# Patient Record
Sex: Female | Born: 1985 | Race: Black or African American | Hispanic: No | Marital: Married
Health system: Midwestern US, Community
[De-identification: ages and names within clinical notes are randomized; demographics above are authoritative.]

## PROBLEM LIST (undated history)

## (undated) DIAGNOSIS — J45909 Unspecified asthma, uncomplicated: Secondary | ICD-10-CM

## (undated) DIAGNOSIS — R7612 Nonspecific reaction to cell mediated immunity measurement of gamma interferon antigen response without active tuberculosis: Secondary | ICD-10-CM

## (undated) HISTORY — DX: Unspecified asthma, uncomplicated: J45.909

## (undated) HISTORY — DX: Nonspecific reaction to cell mediated immunity measurement of gamma interferon antigen response without active tuberculosis: R76.12

---

## 2007-10-01 ENCOUNTER — Inpatient Hospital Stay: Payer: Self-pay | Admitting: Obstetrics and Gynecology

## 2008-06-26 ENCOUNTER — Ambulatory Visit: Payer: Self-pay | Admitting: Internal Medicine

## 2008-09-23 ENCOUNTER — Ambulatory Visit: Payer: Self-pay | Admitting: Internal Medicine

## 2009-07-12 IMAGING — US ABDOMEN ULTRASOUND
1 series · 17 of 25 positions shown · non-contrast
Comparison: none

REASON FOR EXAM: Chest pain, RLQ and  LLQ pain
COMMENTS:

[Series 1: abdomen ultrasound · 17 of 55 slices shown]
[im 1/55]
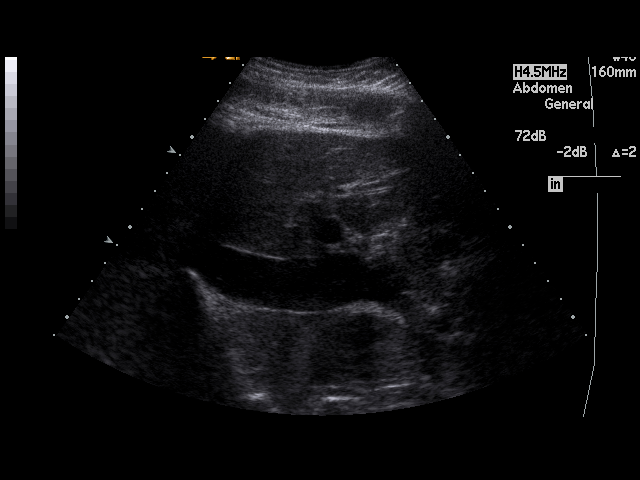
[im 5/55]
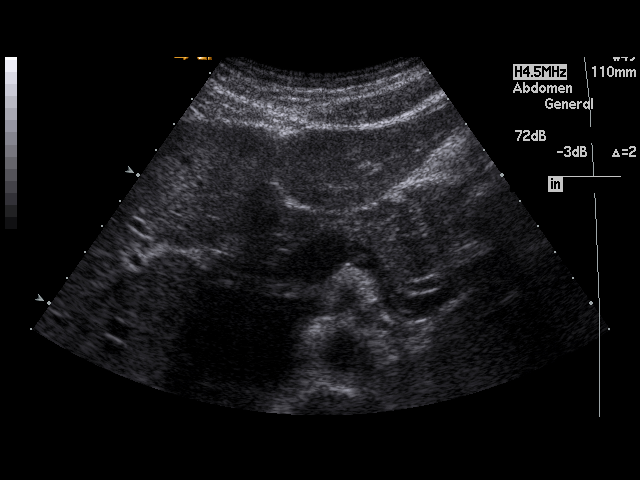
[im 7/55]
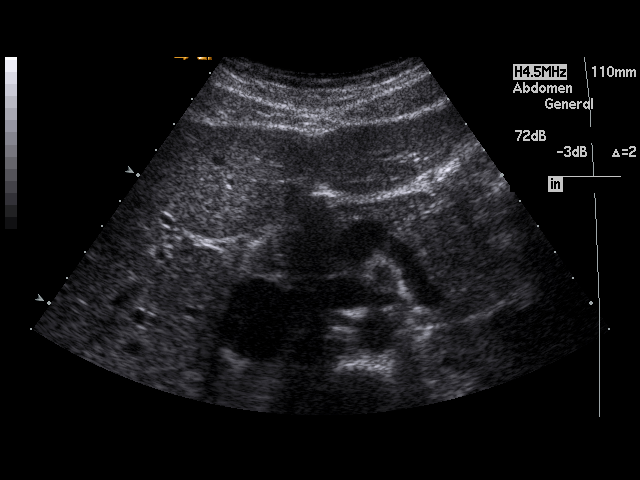
[im 12/55]
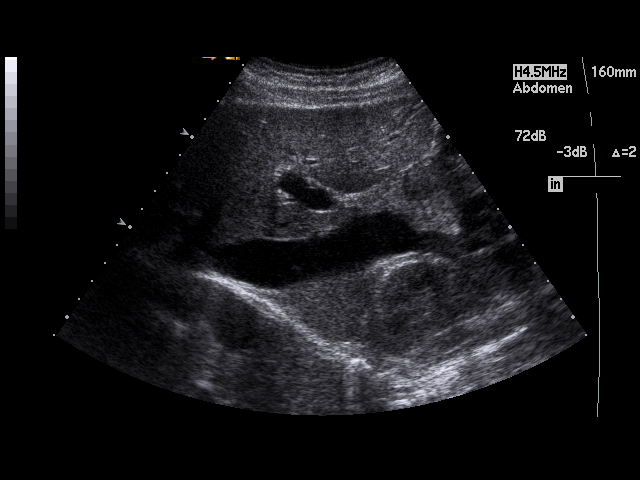
[im 14/55]
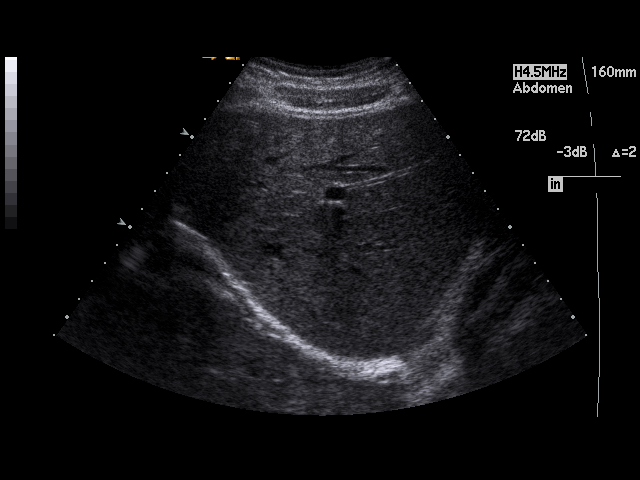
[im 19/55]
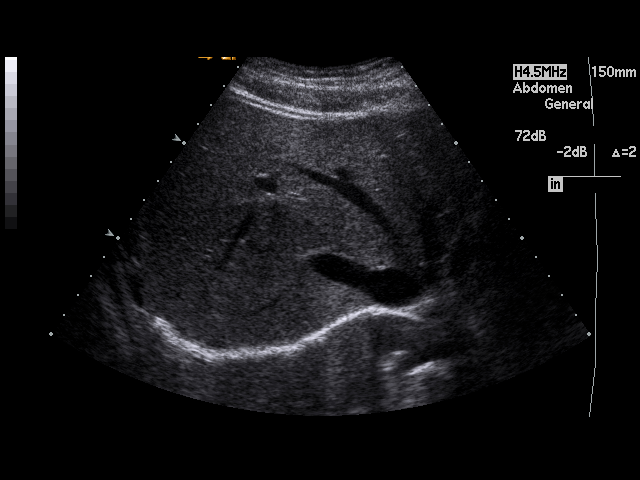
[im 21/55]
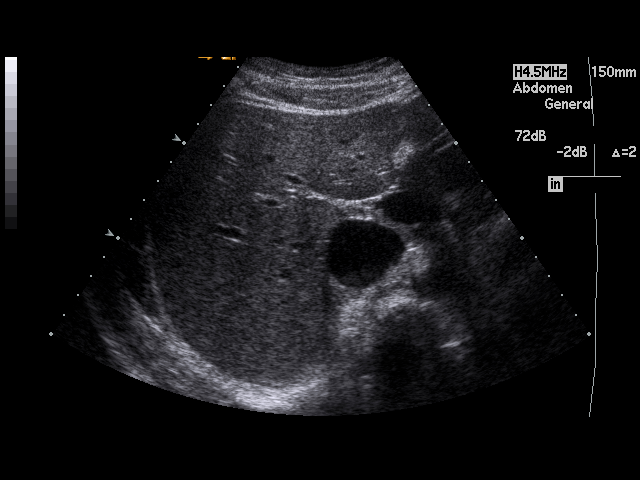
[im 25/55]
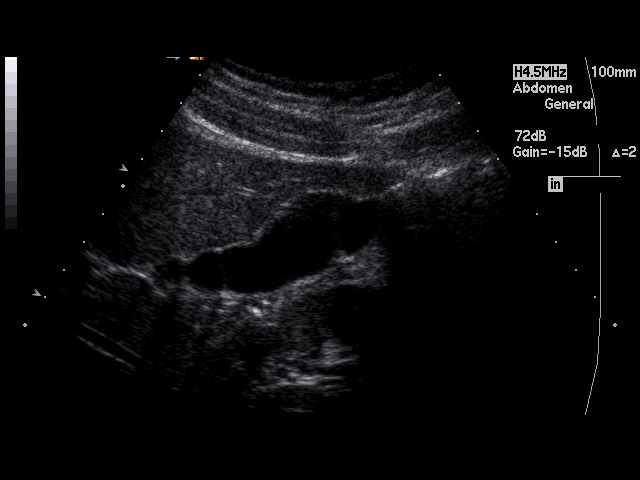
[im 28/55]
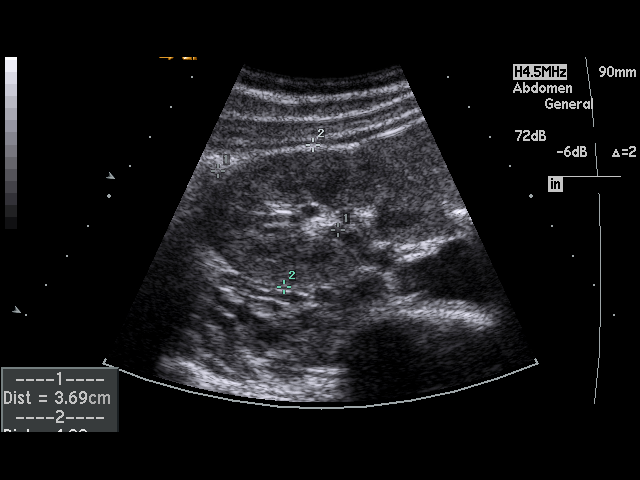
[im 30/55]
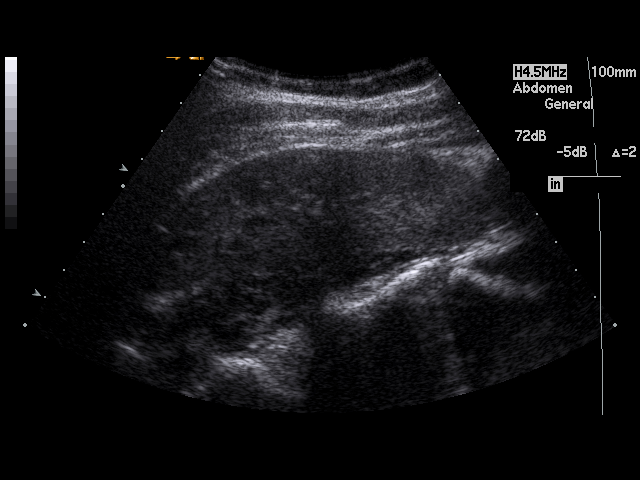
[im 34/55]
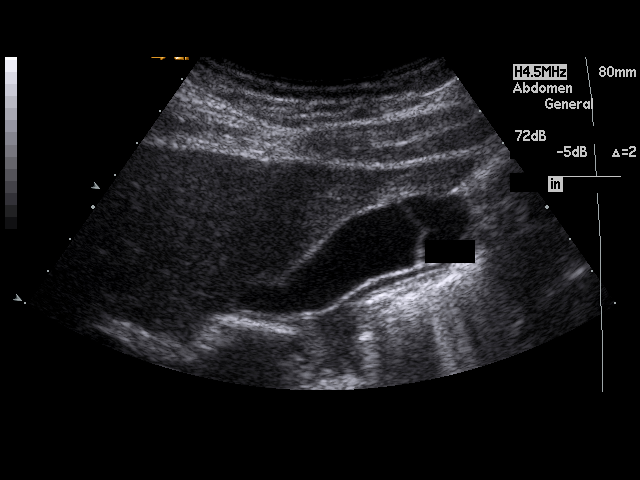
[im 37/55]
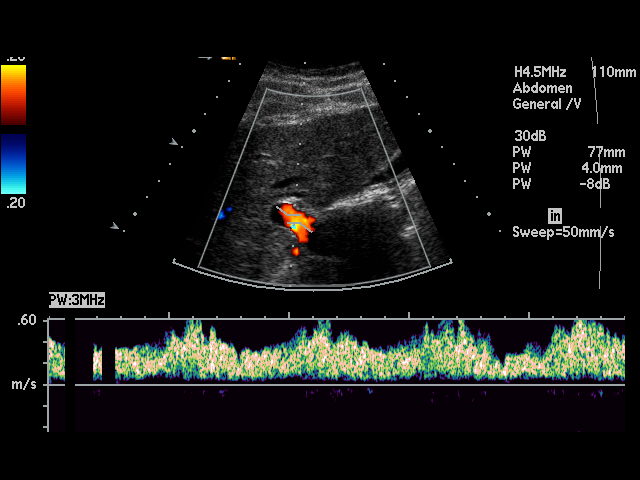
[im 41/55]
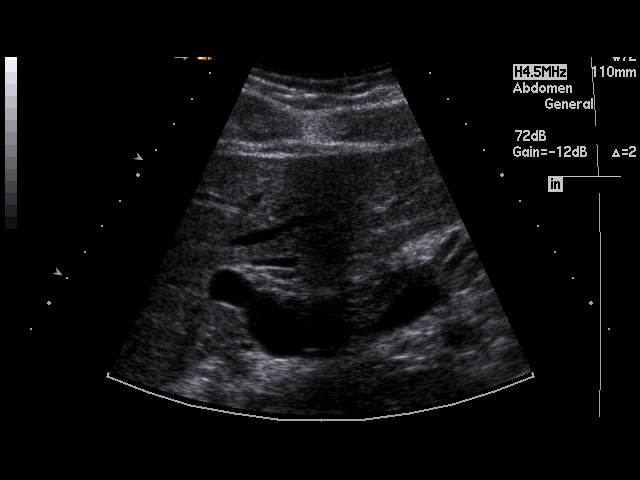
[im 43/55]
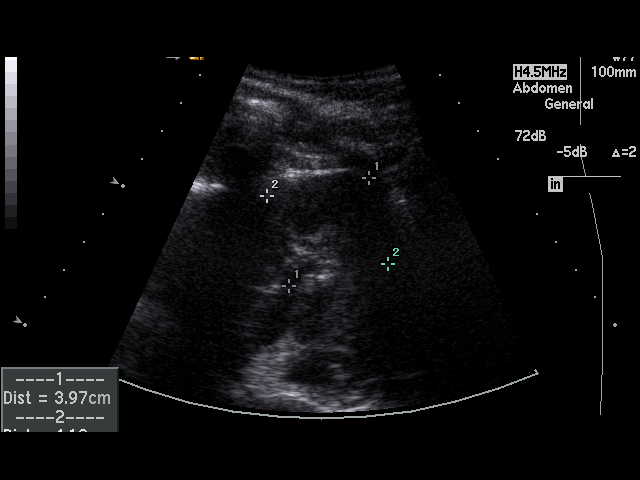
[im 48/55]
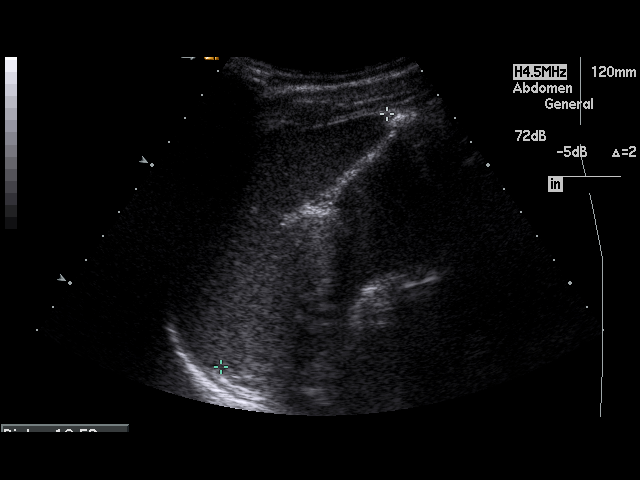
[im 50/55]
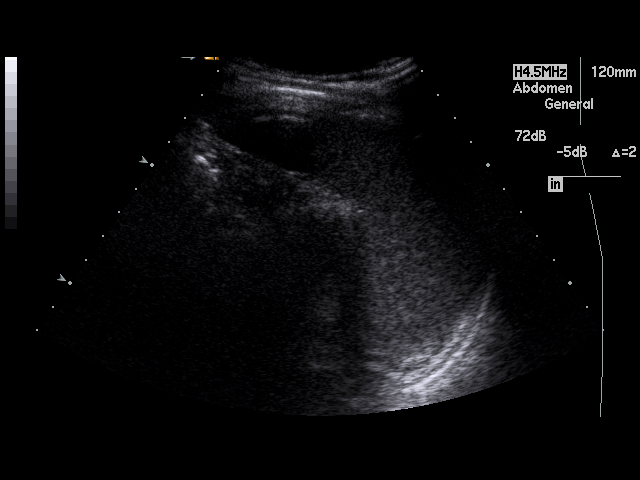
[im 55/55]
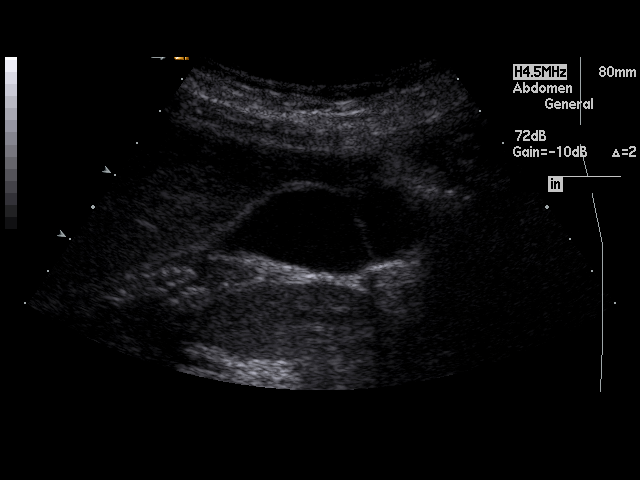

[17 of 25 positions shown; findings below may reference images not displayed]

PROCEDURE:     US  - US ABDOMEN GENERAL SURVEY  - June 26, 2008  [DATE]

RESULT:     The liver, spleen, pancreas and abdominal aorta are normal in
appearance. The pancreas is not visualized adequately for evaluation. No
gallstones are seen. There is no thickening of the gallbladder wall. The
common bile duct measures 2.9 mm in diameter which is within normal limits.
The kidneys show no hydronephrosis. There is no ascites.
IMPRESSION: 1.  No significant abnormalities are noted.
2.  The pancreas is not visualized adequately for evaluation.

## 2009-07-12 IMAGING — US TRANSABDOMINAL ULTRASOUND OF PELVIS
1 series · 17 of 17 positions shown · non-contrast
Comparison: none

REASON FOR EXAM: Chest pain, RLQ LLQ pain
COMMENTS:

[Series 1: transabdominal ultrasound of pelvis · 17 of 17 slices shown]
[im 1/17]
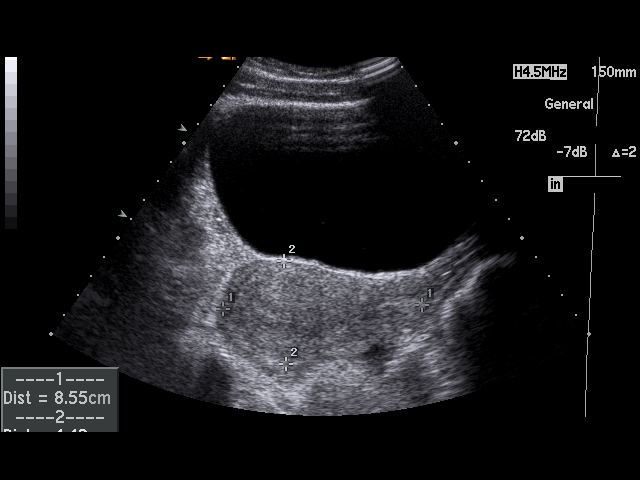
[im 2/17]
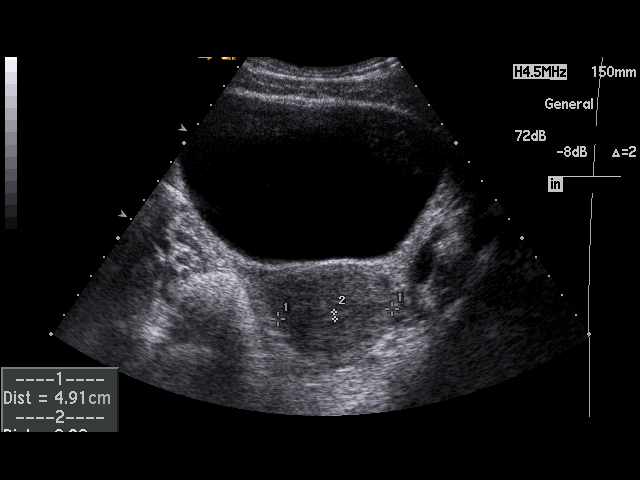
[im 3/17]
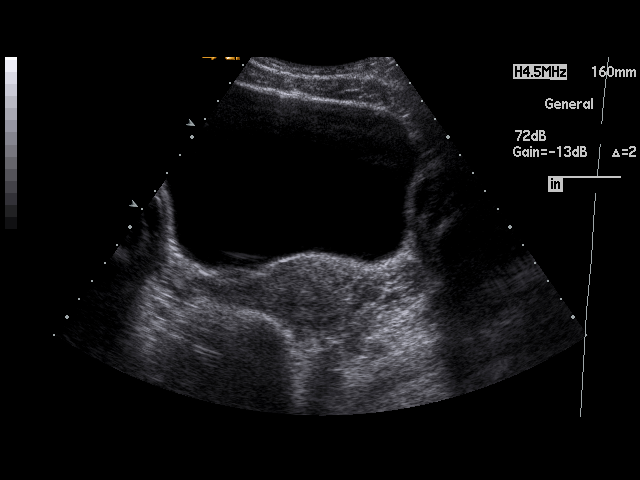
[im 4/17]
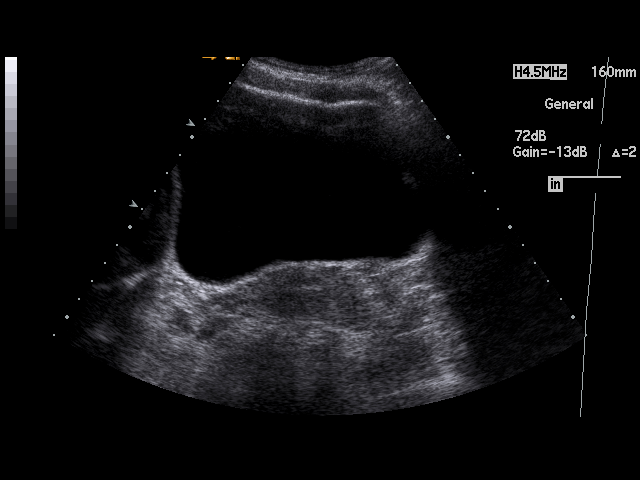
[im 5/17]
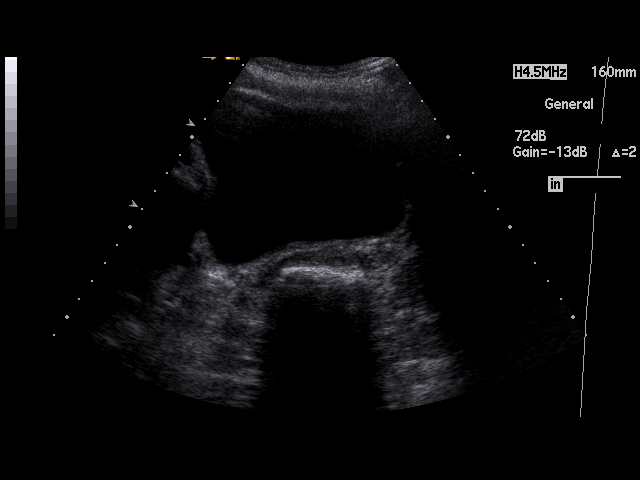
[im 6/17]
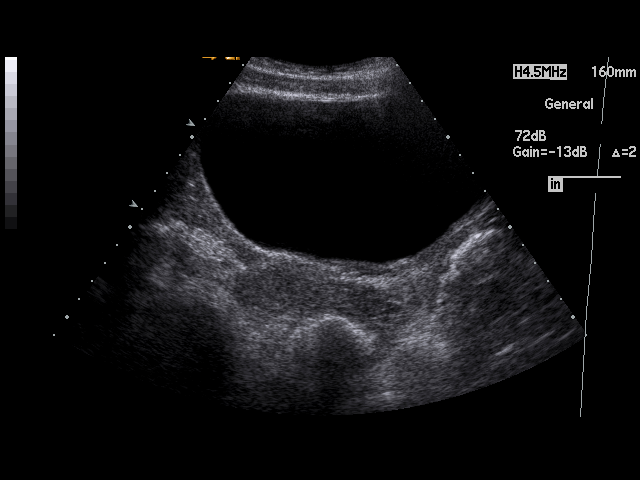
[im 7/17]
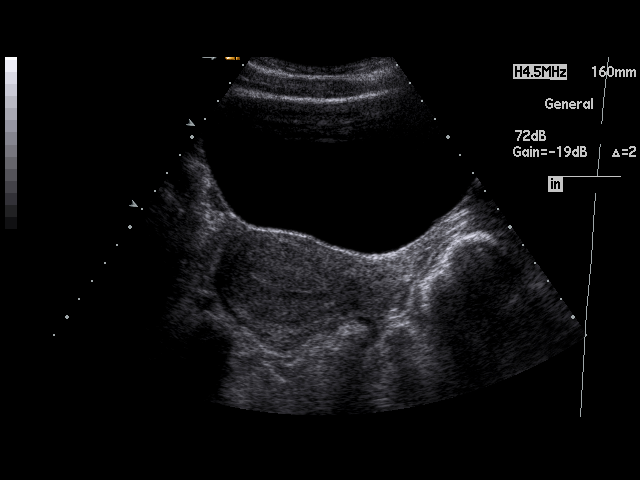
[im 8/17]
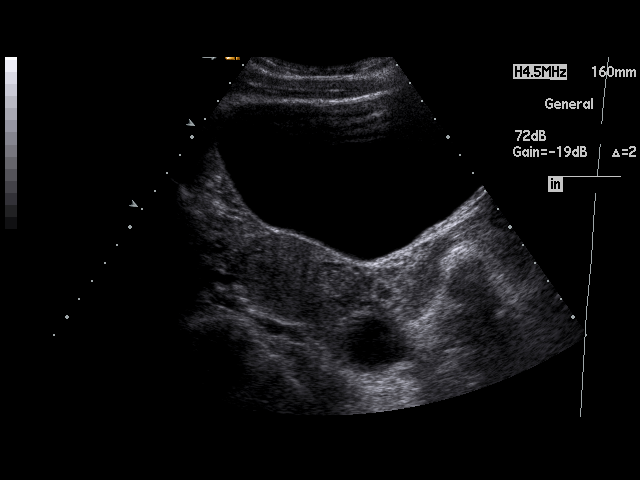
[im 9/17]
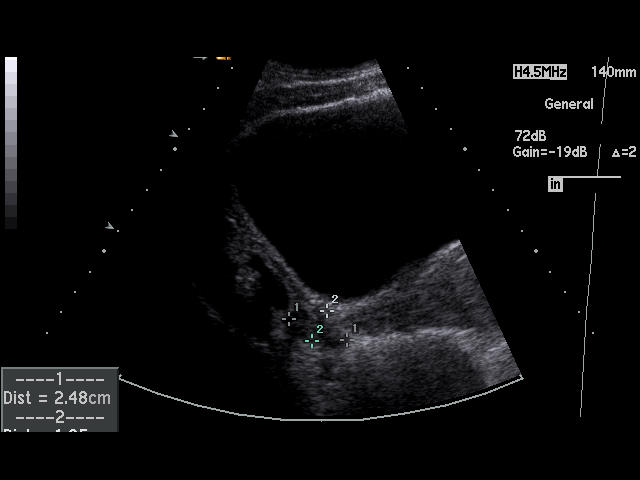
[im 10/17]
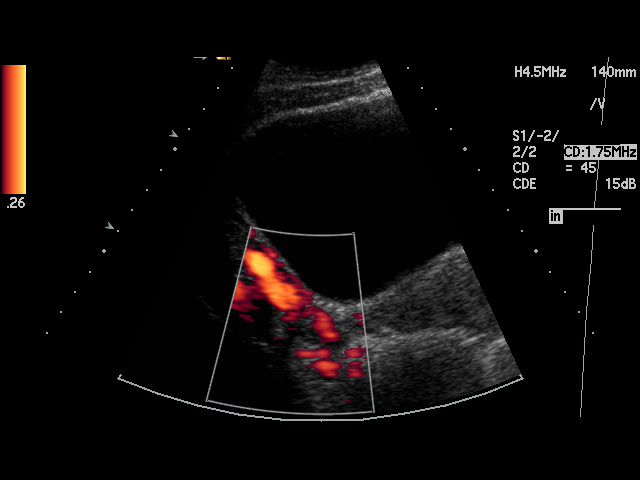
[im 11/17]
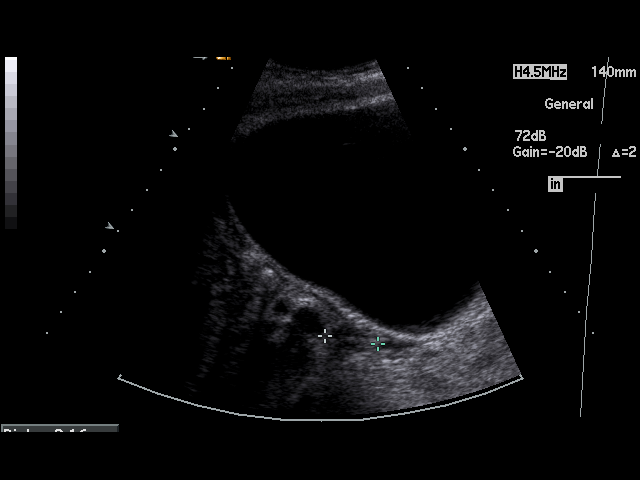
[im 12/17]
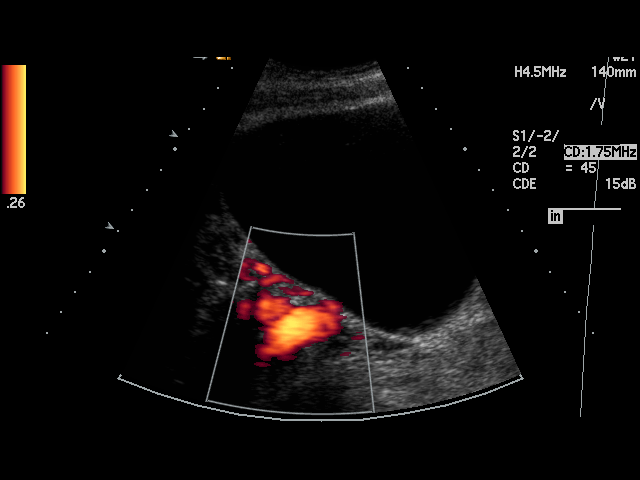
[im 13/17]
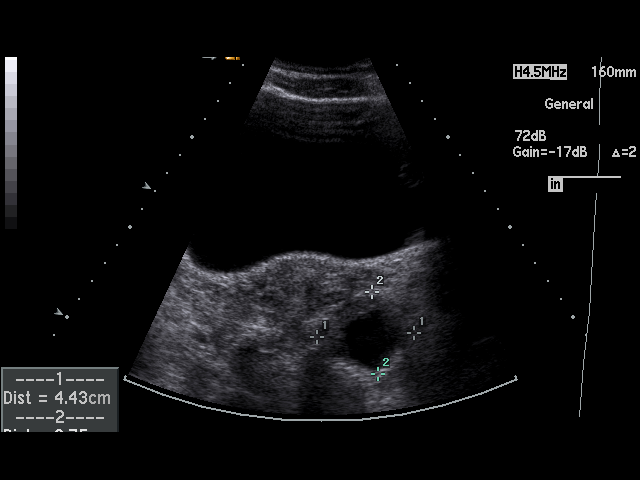
[im 14/17]
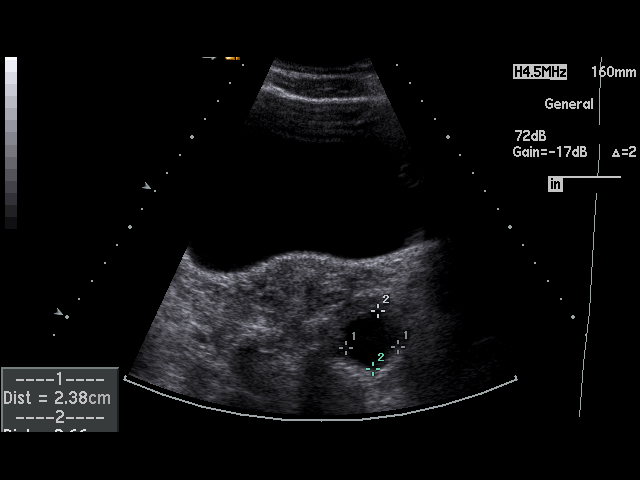
[im 15/17]
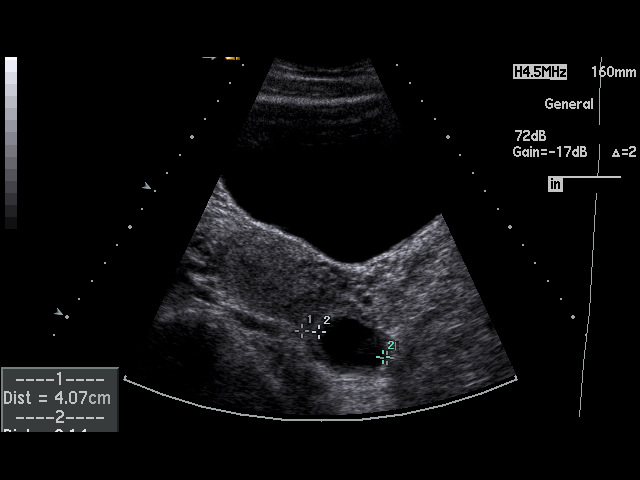
[im 16/17]
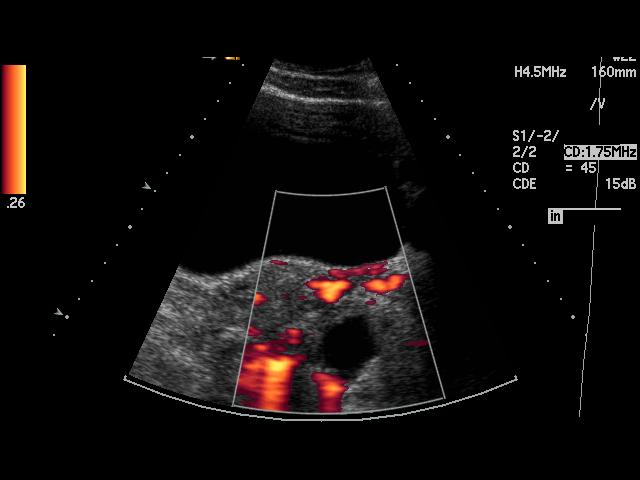
[im 17/17]
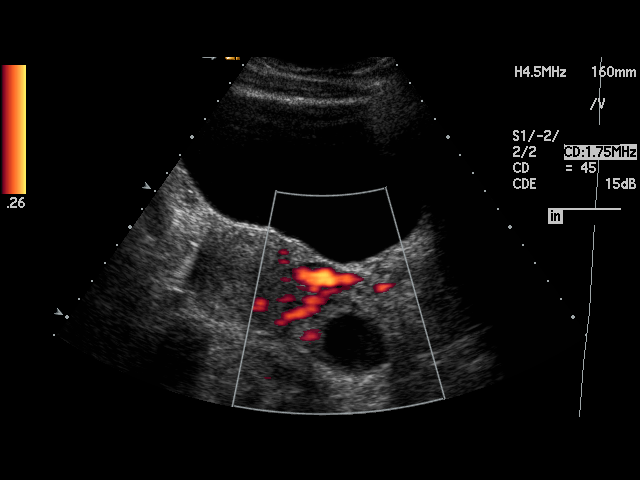

[17 of 17 positions shown; findings below may reference images not displayed]

PROCEDURE:     US  - US PELVIS MASS EXAM  - [DATE] [DATE] [DATE]  [DATE]

RESULT:     The uterus measures 8.55 cm x 4.42 cm x 4.91 cm. The endometrium
measures 3.0 mm in thickness. No intrauterine products of conception are
seen. No uterine mass lesions are noted. The RIGHT and LEFT ovaries are
visualized. The RIGHT ovary measures 2.48 cm at maximum diameter. The LEFT
ovary measures 4.43 cm at maximum diameter. There is a 3.14 cm, simple cyst
of the LEFT ovary. No other adnexal masses are seen. There is no free fluid
in the pelvis. The visualized portion of the urinary bladder is normal in
appearance.
IMPRESSION: There is a 3.14 cm simple cyst of the LEFT ovary. Otherwise,
normal study.

## 2009-10-22 ENCOUNTER — Ambulatory Visit: Payer: Self-pay | Admitting: Internal Medicine

## 2011-06-14 ENCOUNTER — Inpatient Hospital Stay: Payer: Self-pay | Admitting: Obstetrics and Gynecology

## 2012-12-18 ENCOUNTER — Emergency Department: Payer: Self-pay | Admitting: Emergency Medicine

## 2014-01-03 IMAGING — CR DG CHEST 2V
1 series · 2 of 2 positions shown · non-contrast
Comparison: none

REASON FOR EXAM: cough,
COMMENTS:

[Series 1: pa · 0.17mm/px · 2 of 2 slices shown]
[im 1/2]
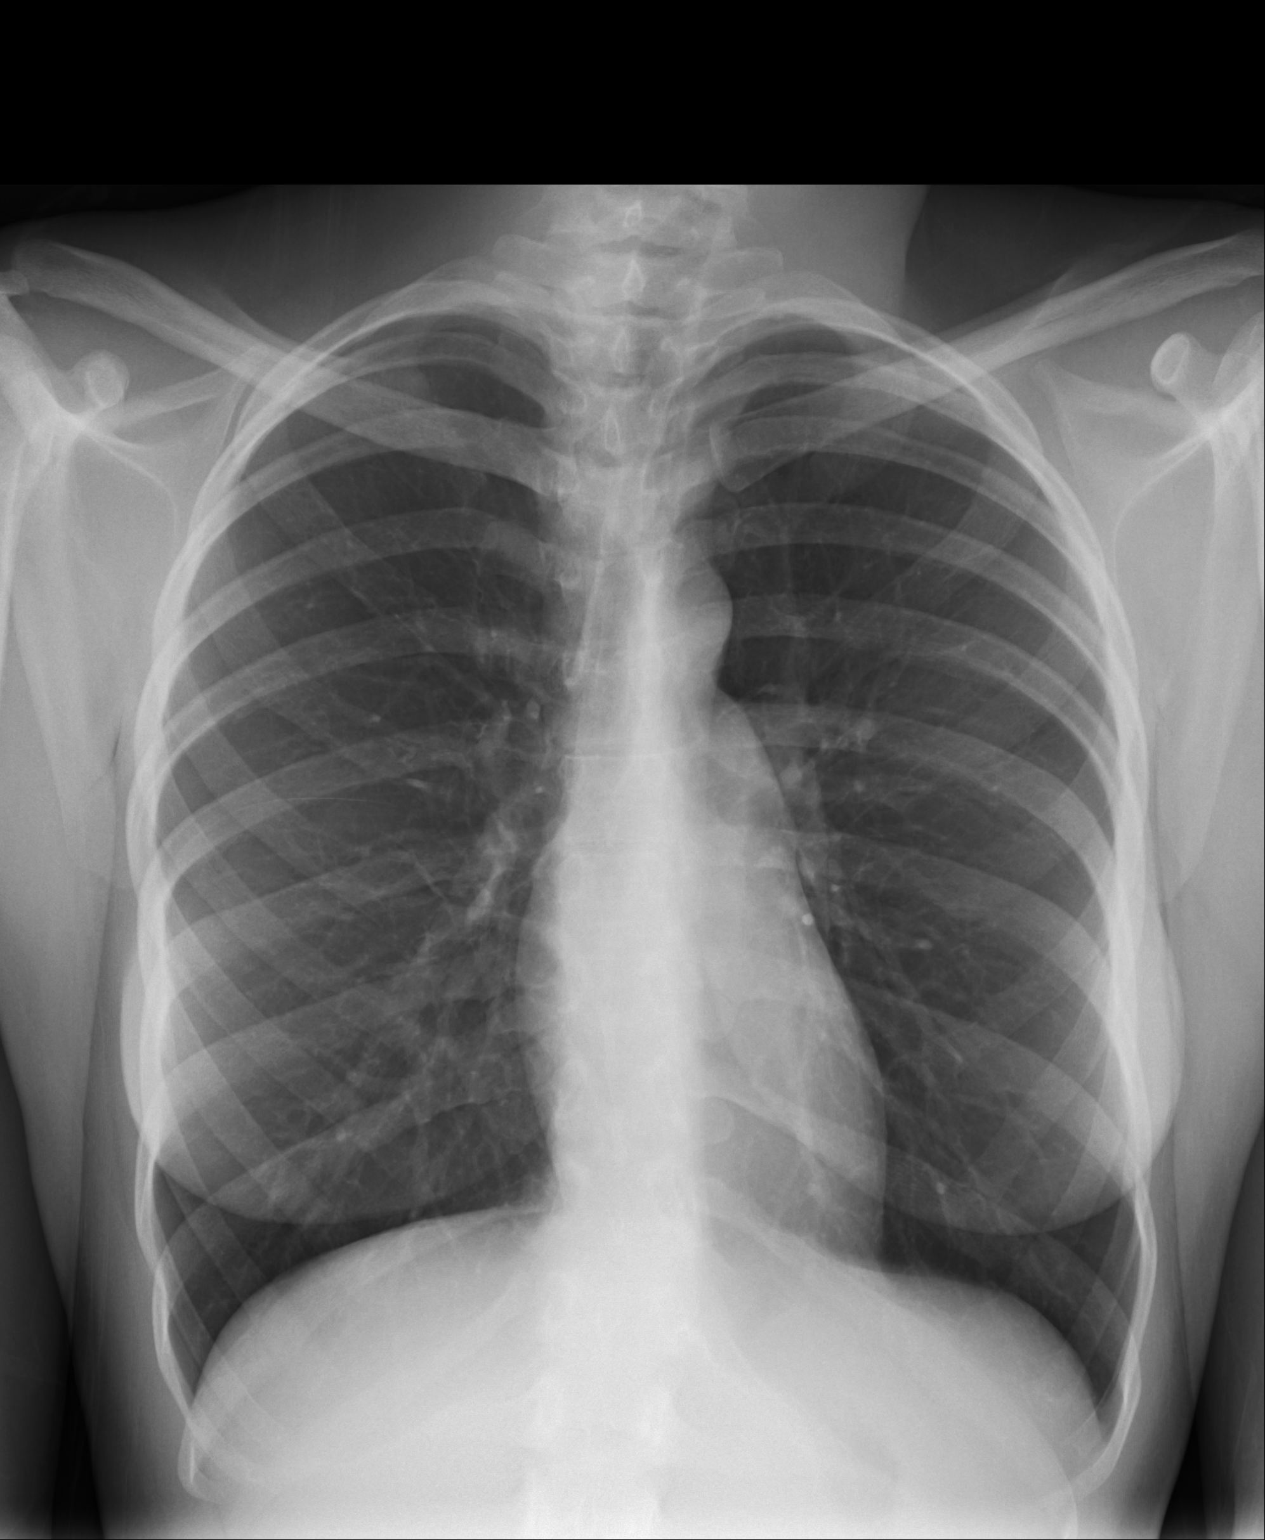
[im 2/2]
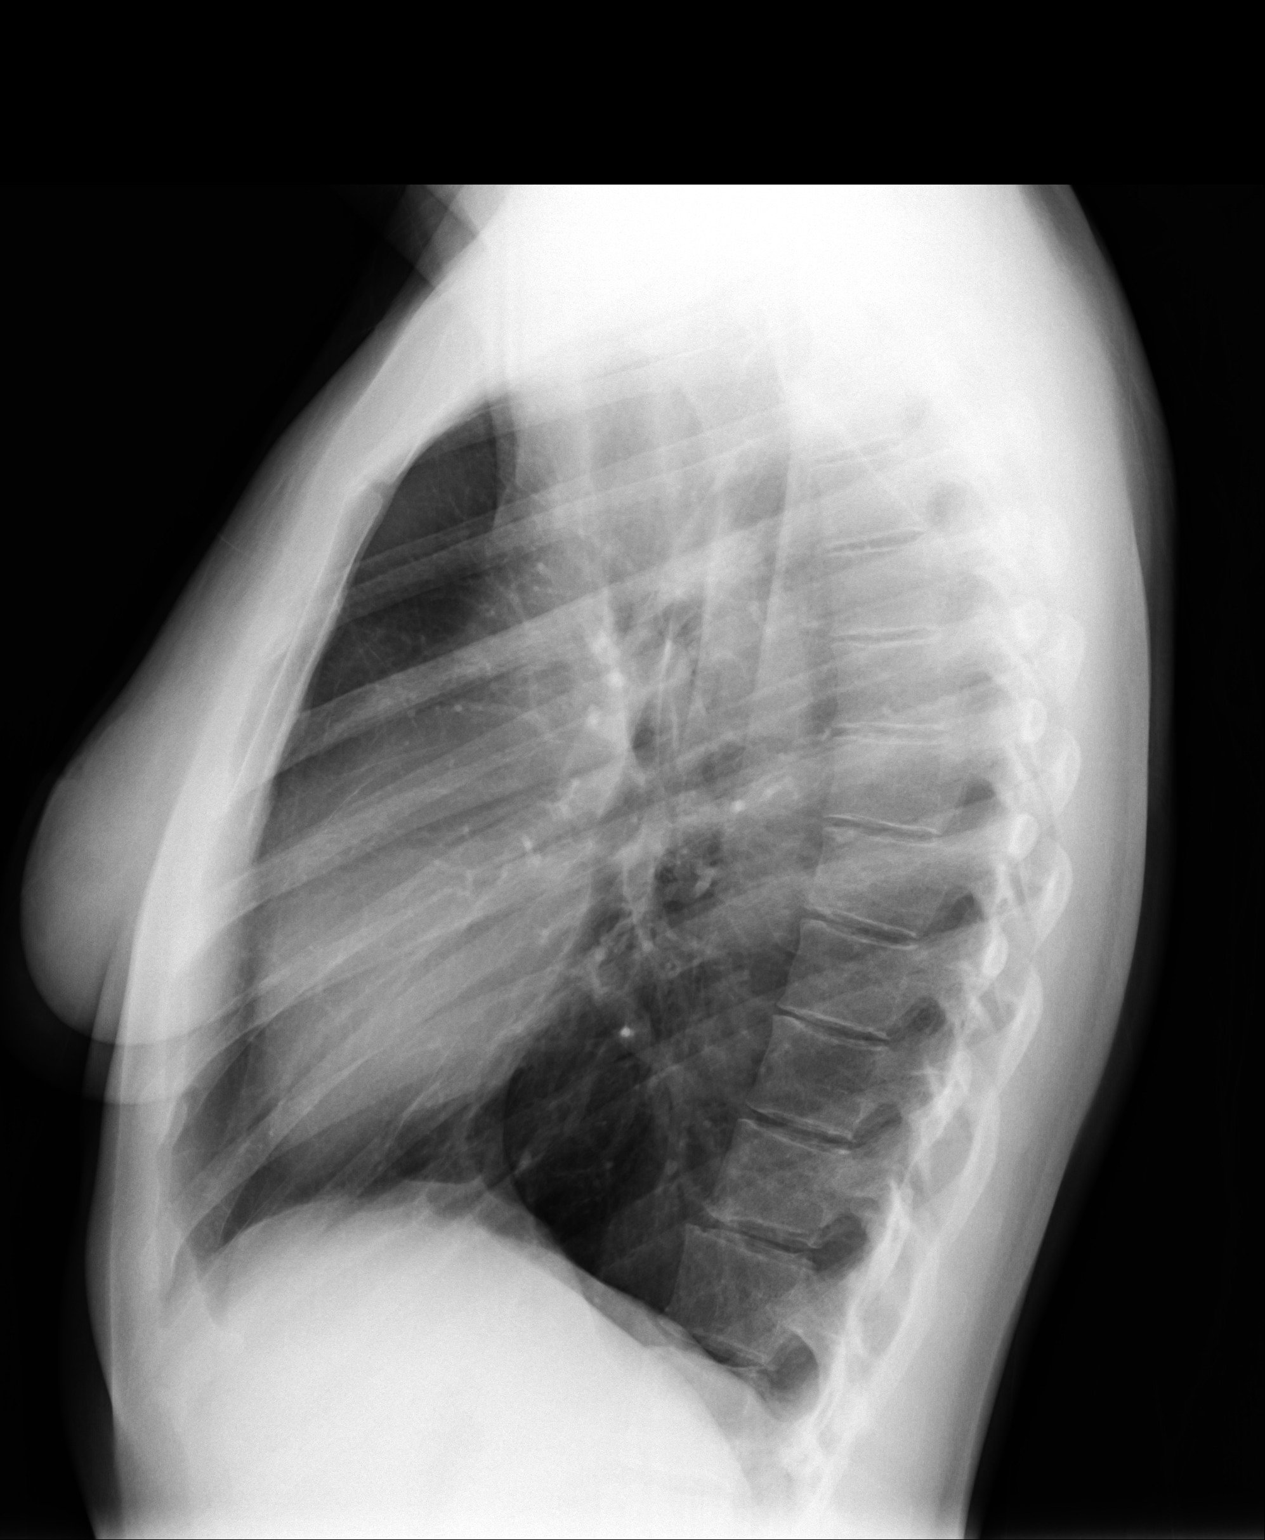

[2 of 2 positions shown; findings below may reference images not displayed]

PROCEDURE:     DXR - DXR CHEST PA (OR AP) AND LATERAL  - December 18, 2012  [DATE]

RESULT:     Is there is mild hyperinflation. The lungs are clear. The heart
and pulmonary vessels are normal. The bony and mediastinal structures are
unremarkable. There is no effusion. There is no pneumothorax or evidence of
congestive failure.
IMPRESSION: No acute cardiopulmonary disease.

[REDACTED]

## 2017-09-21 ENCOUNTER — Encounter: Payer: Self-pay | Admitting: General Surgery

## 2017-10-02 ENCOUNTER — Encounter: Payer: Self-pay | Admitting: *Deleted

## 2017-10-08 ENCOUNTER — Ambulatory Visit: Payer: Self-pay | Admitting: General Surgery

## 2017-10-08 ENCOUNTER — Encounter: Payer: Self-pay | Admitting: *Deleted

## 2017-10-17 ENCOUNTER — Encounter: Payer: Self-pay | Admitting: *Deleted

## 2019-01-13 ENCOUNTER — Ambulatory Visit: Payer: Medicaid Other | Admitting: Obstetrics & Gynecology

## 2019-01-27 ENCOUNTER — Ambulatory Visit: Payer: Medicaid Other | Admitting: Obstetrics & Gynecology

## 2019-01-31 ENCOUNTER — Other Ambulatory Visit: Payer: Self-pay | Admitting: Family Medicine

## 2019-01-31 DIAGNOSIS — R7611 Nonspecific reaction to tuberculin skin test without active tuberculosis: Secondary | ICD-10-CM

## 2019-02-03 ENCOUNTER — Ambulatory Visit
Admission: RE | Admit: 2019-02-03 | Discharge: 2019-02-03 | Disposition: A | Discharge status: Home / Self Care | Payer: Medicaid Other | Source: Ambulatory Visit | Attending: Family Medicine | Admitting: Family Medicine

## 2019-02-03 ENCOUNTER — Other Ambulatory Visit: Payer: Self-pay

## 2019-02-03 DIAGNOSIS — R7611 Nonspecific reaction to tuberculin skin test without active tuberculosis: Secondary | ICD-10-CM | POA: Diagnosis not present

## 2019-04-17 ENCOUNTER — Other Ambulatory Visit: Payer: Self-pay | Admitting: Otolaryngology

## 2019-04-17 DIAGNOSIS — E041 Nontoxic single thyroid nodule: Secondary | ICD-10-CM

## 2019-04-25 ENCOUNTER — Ambulatory Visit: Payer: Medicaid Other

## 2019-04-29 ENCOUNTER — Ambulatory Visit: Payer: Medicaid Other

## 2019-04-29 ENCOUNTER — Other Ambulatory Visit: Payer: Medicaid Other

## 2019-04-30 ENCOUNTER — Ambulatory Visit
Admission: RE | Admit: 2019-04-30 | Discharge: 2019-04-30 | Disposition: A | Discharge status: Home / Self Care | Payer: Medicaid Other | Source: Ambulatory Visit | Attending: Otolaryngology | Admitting: Otolaryngology

## 2019-04-30 ENCOUNTER — Other Ambulatory Visit: Payer: Self-pay

## 2019-04-30 DIAGNOSIS — E041 Nontoxic single thyroid nodule: Secondary | ICD-10-CM

## 2019-05-29 ENCOUNTER — Telehealth: Payer: Self-pay

## 2019-05-29 NOTE — Telephone Encounter (Signed)
TC from patient. Reports still has a few Rifampin capsules left from her 04/16/19 visit.  Co Rifampin has to be taken daily. Admits to forgetting to take medication some days.  Appt scheduled for 06/02/19. Patient instructed to bring Rifampin bottle and any remaining capsules. Patient verbalized understanding Aileen Fass, RN

## 2019-07-21 ENCOUNTER — Telehealth: Payer: Self-pay

## 2019-08-05 NOTE — Telephone Encounter (Signed)
Mailed letter requesting contact if patient wishes to continue/start Rifampin over. Closed to TB f/u. Aileen Fass, RN

## 2019-09-04 ENCOUNTER — Other Ambulatory Visit: Payer: Self-pay | Admitting: Family Medicine

## 2019-09-04 NOTE — Consult Note (Signed)
Erroneous note

## 2019-09-04 NOTE — Progress Notes (Signed)
Tuberculosis treatment orders  All patients are to be monitored per [place] and county TB policies.    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 Ernestina Patches.  Patient started LTBI tx on 04/10/2019 and didn't make it to and follow up LTBI appts.  TB RN had closed to follow up after multiple attempts to reach patient.   +Quantiferon TB Gold 01/27/19; Normal CXR 02/03/19.

## 2019-09-08 NOTE — Progress Notes (Signed)
Curator for clinical support staff: Patient needs to start LTBI treatment. Patient will restart Rifampin.   Caren Macadam, MD, MPH, ABFM [facility] Medical Director

## 2019-09-09 ENCOUNTER — Ambulatory Visit: Payer: Medicaid Other | Admitting: Obstetrics & Gynecology

## 2019-09-15 ENCOUNTER — Telehealth: Payer: Self-pay | Admitting: General Practice

## 2019-09-15 NOTE — Telephone Encounter (Signed)
patient called and requested to speak to you regarding meds

## 2019-10-08 ENCOUNTER — Ambulatory Visit (LOCAL_COMMUNITY_HEALTH_CENTER): Payer: Self-pay

## 2019-10-08 ENCOUNTER — Other Ambulatory Visit: Payer: Self-pay

## 2019-10-08 VITALS — Ht 67.0 in | Wt 137.0 lb

## 2019-10-08 DIAGNOSIS — R7612 Nonspecific reaction to cell mediated immunity measurement of gamma interferon antigen response without active tuberculosis: Secondary | ICD-10-CM

## 2019-10-08 MED ORDER — RIFAMPIN 300 MG PO CAPS: 600.0000 mg | ORAL_CAPSULE | Freq: Every day | ORAL | 3 refills | Status: AC

## 2019-10-08 NOTE — Progress Notes (Signed)
See scanned documents for additional hx related to positive Quantiferon Gold TB test (from [employer]). Consulted provider due to pt's dx of asthma and current medications of Albuterol PRN and Vitamin D daily. Per verbal order by Dr. Lauretta Chester, pt to proceed with LTBI restart of Rifampin 600 mg po qd x 4 months. 1st bottle of Rifampin dispensed today. Pt sent to lab for LFTs. Pt to RTC on 11/05/2019 for monthly LFTs and 2nd bottle of Rifampin.

## 2019-10-09 LAB — HEPATIC FUNCTION PANEL
ALT: 12 IU/L (ref 0–32)
AST: 14 IU/L (ref 0–40)
Albumin: 4.7 g/dL (ref 3.8–4.8)
Alkaline Phosphatase: 65 IU/L (ref 39–117)
Bilirubin Total: 0.8 mg/dL (ref 0.0–1.2)
Bilirubin, Direct: 0.19 mg/dL (ref 0.00–0.40)
Total Protein: 7.5 g/dL (ref 6.0–8.5)

## 2019-10-10 ENCOUNTER — Encounter (INDEPENDENT_AMBULATORY_CARE_PROVIDER_SITE_OTHER): Payer: Medicaid Other | Admitting: Vascular Surgery

## 2019-10-28 ENCOUNTER — Encounter (INDEPENDENT_AMBULATORY_CARE_PROVIDER_SITE_OTHER): Payer: Medicaid Other | Admitting: Vascular Surgery

## 2019-11-06 ENCOUNTER — Telehealth: Payer: Self-pay

## 2019-11-11 NOTE — Telephone Encounter (Signed)
Letter mailed to patient requesting contact re: [facility] [place] Campbell, RN

## 2020-01-16 NOTE — Telephone Encounter (Signed)
Closed to TB f/u Iness Pangilinan, RN  

## 2020-02-12 ENCOUNTER — Telehealth: Payer: Self-pay | Admitting: Obstetrics & Gynecology

## 2020-02-12 NOTE — Telephone Encounter (Signed)
[facility] Family referring for pap smear, Hx BV. Called and left voicemail for patient to call back to be schedule

## 2020-03-02 NOTE — Progress Notes (Deleted)
PCP:  Lorelee Market, MD   No chief complaint on file.    HPI:      Makayla Freeman is a 34 y.o. No obstetric history on file. who LMP was No LMP recorded., presents today for her annual examination.  Her menses are {norm/abn:715}, lasting {number:22536} days.  Dysmenorrhea {dysmen:716}. She {does:18564} have intermenstrual bleeding.  Sex activity: {sex active:315163}.  Last Pap: {WCHE:527782423}  Results were: {norm/abn:16707::"no abnormalities"} /neg HPV DNA *** Hx of STDs: {STD hx:14358}  Last mammogram: {date:304500300}  Results were: {norm/abn:13465} There is no FH of breast cancer. There is no FH of ovarian cancer. The patient {does:18564} do self-breast exams.  Tobacco use: {tob:20664} Alcohol use: {Alcohol:11675} No drug use.  Exercise: {exercise:31265}  She {does:18564} get adequate calcium and Vitamin D in her diet.   Past Medical History:  Diagnosis Date  . Asthma    per pt  . Positive QuantiFERON-TB Gold test     *** The histories are not reviewed yet. Please review them in the "History" navigator section and refresh this Jenkins.  No family history on file.  Social History   Socioeconomic History  . Marital status: Single    Spouse name: Not on file  . Number of children: Not on file  . Years of education: Not on file  . Highest education level: Not on file  Occupational History  . Not on file  Tobacco Use  . Smoking status: Never Smoker  . Smokeless tobacco: Never Used  Substance and Sexual Activity  . Alcohol use: Not on file  . Drug use: Not on file  . Sexual activity: Not on file  Other Topics Concern  . Not on file  Social History Narrative  . Not on file   Social Determinants of Health   Financial Resource Strain:   . Difficulty of Paying Living Expenses:   Food Insecurity:   . Worried About Charity fundraiser in the Last Year:   . Arboriculturist in the Last Year:   Transportation Needs:   . Film/video editor  (Medical):   Marland Kitchen Lack of Transportation (Non-Medical):   Physical Activity:   . Days of Exercise per Week:   . Minutes of Exercise per Session:   Stress:   . Feeling of Stress :   Social Connections:   . Frequency of Communication with Friends and Family:   . Frequency of Social Gatherings with Friends and Family:   . Attends Religious Services:   . Active Member of Clubs or Organizations:   . Attends Archivist Meetings:   Marland Kitchen Marital Status:   Intimate Partner Violence:   . Fear of Current or Ex-Partner:   . Emotionally Abused:   Marland Kitchen Physically Abused:   . Sexually Abused:      Current Outpatient Medications:  .  albuterol (ACCUNEB) 0.63 MG/3ML nebulizer solution, Take 1 ampule by nebulization every 6 (six) hours as needed for wheezing. As needed - dx of asthma per pt, Disp: , Rfl:  .  Cholecalciferol (VITAMIN D HIGH POTENCY) 25 MCG (1000 UT) capsule, Take 1,000 Units by mouth daily., Disp: , Rfl:      ROS:  Review of Systems BREAST: No symptoms   Objective: There were no vitals taken for this visit.   OBGyn Exam  Results: No results found for this or any previous visit (from the past 24 hour(s)).  Assessment/Plan: No diagnosis found.  No orders of the defined types were placed in this  encounter.            GYN counsel {counseling:16159}     F/U  No follow-ups on file.  Jadea Shiffer B. Sherard Sutch, PA-C 03/02/2020 4:33 PM

## 2020-03-03 ENCOUNTER — Encounter: Payer: Medicaid Other | Admitting: Obstetrics and Gynecology

## 2020-05-14 ENCOUNTER — Inpatient Hospital Stay
Admit: 2020-05-14 | Discharge: 2020-05-14 | Disposition: A | Discharge status: Home / Self Care | Payer: MEDICAID | Attending: Emergency Medicine

## 2020-05-14 ENCOUNTER — Emergency Department: Admit: 2020-05-14 | Payer: MEDICAID

## 2020-05-14 DIAGNOSIS — K529 Noninfective gastroenteritis and colitis, unspecified: Secondary | ICD-10-CM

## 2020-05-14 LAB — URINALYSIS W/ REFLEX CULTURE
BACTERIA, URINE: NEGATIVE /hpf
Bacteria: NEGATIVE /hpf
Bilirubin, Urine: NEGATIVE
Bilirubin: NEGATIVE
Blood, Urine: NEGATIVE
Blood: NEGATIVE
Glucose, Ur: NEGATIVE mg/dL
Glucose: NEGATIVE mg/dL
Ketone: NEGATIVE mg/dL
Ketones, Urine: NEGATIVE mg/dL
Leukocyte Esterase, Urine: NEGATIVE
Leukocyte Esterase: NEGATIVE
Nitrite, Urine: NEGATIVE
Nitrites: NEGATIVE
Protein, UA: NEGATIVE mg/dL
Protein: NEGATIVE mg/dL
Specific Gravity, UA: <1.005 NA (ref 1.003–1.030)
Specific gravity: <1.005 (ref 1.003–1.030)
Urobilinogen, UA, POCT: 1 EU/dL (ref 0.2–1.0)
Urobilinogen: 1 EU/dL (ref 0.2–1.0)
pH (UA): 7 (ref 5.0–8.0)
pH, UA: 7 (ref 5.0–8.0)

## 2020-05-14 LAB — HCG URINE, QL. - POC
HCG, Pregnancy, Urine, POC: NEGATIVE
Pregnancy test,urine (POC): NEGATIVE

## 2020-05-14 LAB — COVID-19 WITH INFLUENZA A/B
Influenza A By PCR: NOT DETECTED
Influenza A by PCR: NOT DETECTED
Influenza B By PCR: NOT DETECTED
Influenza B by PCR: NOT DETECTED
SARS-CoV-2 by PCR: NOT DETECTED
SARS-CoV-2: NOT DETECTED

## 2020-05-14 MED ORDER — IBUPROFEN 600 MG TAB
600 mg | ORAL_TABLET | Freq: Four times a day (QID) | ORAL | 0 refills | Status: AC | PRN
Start: 2020-05-14 — End: ?

## 2020-05-14 MED ORDER — BENZONATATE 100 MG CAP
100 mg | ORAL_CAPSULE | Freq: Three times a day (TID) | ORAL | 0 refills | Status: AC | PRN
Start: 2020-05-14 — End: 2020-05-19

## 2020-05-14 MED ORDER — ONDANSETRON 4 MG TAB, RAPID DISSOLVE
4 mg | ORAL | Status: AC
Start: 2020-05-14 — End: 2020-05-14
  Administered 2020-05-14: 18:00:00 via ORAL

## 2020-05-14 MED ORDER — ONDANSETRON 4 MG TAB, RAPID DISSOLVE
4 mg | ORAL_TABLET | Freq: Three times a day (TID) | ORAL | 0 refills | Status: AC | PRN
Start: 2020-05-14 — End: ?

## 2020-05-14 MED FILL — ONDANSETRON 4 MG TAB, RAPID DISSOLVE: 4 mg | ORAL | Qty: 1

## 2020-05-14 NOTE — ED Notes (Signed)
 Pt presents to ED ambulatory complaining of cough, abdominal pain, N/V/D, and chest congestion x 2 days. Pt reports hx of asthma and that symptoms are unrelieved with inhaler use. Pt reports taking mucinex and using vick's vapor rub without relief. Pt reports her apartment has black mold. Pt's lungs clear on auscultation. Pt is alert and oriented x 4, skin is warm and dry. Assessment completed and pt updated on plan of care.  Call bell in reach.       Emergency Department Nursing Plan of Care       The Nursing Plan of Care is developed from the Nursing assessment and Emergency Department Attending provider initial evaluation.  The plan of care may be reviewed in the "ED Provider note".    The Plan of Care was developed with the following considerations:   Patient / Family readiness to learn indicated ab:czmajopszi understanding  Persons(s) to be included in education: patient  Barriers to Learning/Limitations:No    Signed     Luster Trudy GAILS    05/14/2020   1:10 PM

## 2020-05-14 NOTE — ED Notes (Signed)
Discharge instructions were given to the patient by Alaina Williams V, RN.     The patient left the Emergency Department ambulatory, alert and oriented and in no acute distress with 3 prescriptions. The patient was encouraged to call or return to the ED for worsening issues or problems and was encouraged to schedule a follow up appointment for continuing care.     The patient verbalized understanding of discharge instructions and prescriptions, all questions were answered. The patient has no further concerns at this time.

## 2020-05-14 NOTE — ED Notes (Signed)
Patient presents to ED with c/o cough and possible exposure to mold

## 2020-05-14 NOTE — ED Notes (Signed)
Pt tolerated PO challenge, PA made aware.

## 2020-05-14 NOTE — ED Provider Notes (Signed)
ED Provider Notes by Erskin Burnet, PA-C at 05/14/20 1354                Author: Erskin Burnet, PA-C  Service: --  Author Type: Physician Assistant       Filed: 05/14/20 2056  Date of Service: 05/14/20 1354  Status: Attested           Editor: Bubba Camp (Physician Assistant)  Cosigner: Alfonse Spruce, MD at 05/22/20 [phone]          Attestation signed by Alfonse Spruce, MD at 05/22/20 [phone]          I was personally available for consultation in the emergency department.  I have reviewed the chart and agree with the documentation recorded by the [facility], including  the assessment, treatment plan, and disposition.   Alfonse Spruce, MD                                    EMERGENCY DEPARTMENT HISTORY AND PHYSICAL EXAM      Date: 05/14/2020   Patient Name: Ariel Jacobs        History of Presenting Illness          Chief Complaint       Patient presents with        ?  Cough              History Provided By: Patient      HPI: Ariel Jacobs is a 34 y.o.  female with a PMH of asthma who  presents with N/V/D, abd pain and cough x 4 days.  Pt states she initially thought she had a "stomach virus" but 2 days ago she started cough and having associated CP.  Pt states she also has black mold in her home so unsure if symptoms were related.   Pt here with daughter who also has similar symptoms.  Pt states they have been in apartment x 2wks.  Pt reports vomiting usually occurs between 1a-7p every morning with multiple episodes of emesis.  Pt has not had anything to eat or drink today.  LMp  2 wks ago.  Pt does not drink alcohol or smoke cigarettes.      PCP: None              Past History        Past Medical History:     Past Medical History:        Diagnosis  Date         ?  Asthma             Past Surgical History:   History reviewed. No pertinent surgical history.      Family History:   History reviewed. No pertinent family history.      Social History:     Social History          Tobacco Use          ?  Smoking status:  Never Smoker     ?  Smokeless tobacco:  Never Used       Substance Use Topics         ?  Alcohol use:  Never         ?  Drug use:  Never           Allergies:   No Known Allergies  Review of Systems     Review of Systems    Constitutional: Negative for chills and fever.    HENT: Negative for sore throat.     Respiratory: Positive for cough.     Cardiovascular: Positive for chest pain.    Gastrointestinal: Positive for abdominal pain, diarrhea , nausea and vomiting.     Genitourinary: Negative for dysuria and frequency.    Allergic/Immunologic: Negative for immunocompromised state.    Neurological: Negative for speech difficulty and weakness.    All other systems reviewed and are negative.           Physical Exam          Vitals:          05/14/20 1259        BP:  126/88     Pulse:  86     Resp:  18     Temp:  98.5 ??F (36.9 ??C)     SpO2:  100%        Weight:  61.2 kg (135 lb)        Physical Exam   Vitals and nursing note reviewed.   Constitutional:        General: She is not in acute distress.     Appearance: She is well-developed.    HENT:       Head: Normocephalic and atraumatic.   Eyes :       Conjunctiva/sclera: Conjunctivae normal.   Cardiovascular:       Rate and Rhythm: Normal rate and regular rhythm.      Heart sounds: Normal heart sounds.    Pulmonary:       Effort: Pulmonary effort is normal. No respiratory distress.      Breath sounds: Normal breath sounds. No stridor. No wheezing, rhonchi  or rales.    Abdominal:      General: Bowel sounds are normal.      Palpations: Abdomen is soft.      Tenderness: There is abdominal tenderness  in the left upper quadrant.    Skin:      General: Skin is warm and dry.   Neurological :       Mental Status: She is alert and oriented to person, place, and time.    Psychiatric:         Behavior: Behavior normal.         Thought Content: Thought content normal.         Judgment: Judgment normal.                  Diagnostic Study Results         Labs -         Recent Results (from the past 12 hour(s))     URINALYSIS W/ REFLEX CULTURE          Collection Time: 05/14/20  1:38 PM       Specimen: Urine         Result  Value  Ref Range            Color  YELLOW/STRAW          Appearance  CLEAR  CLEAR         Specific gravity  <1.005  1.003 - 1.030       pH (UA)  7.0  5.0 - 8.0         Protein  Negative  NEG mg/dL       Glucose  Negative  NEG mg/dL       Ketone  Negative  NEG mg/dL       Bilirubin  Negative  NEG         Blood  Negative  NEG         Urobilinogen  1.0  0.2 - 1.0 EU/dL       Nitrites  Negative  NEG         Leukocyte Esterase  Negative  NEG         WBC  0-4  0 - 4 /hpf       RBC  0-5  0 - 5 /hpf       Epithelial cells  FEW  FEW /lpf       Bacteria  Negative  NEG /hpf       UA:UC IF INDICATED  CULTURE NOT INDICATED BY UA RESULT  CNI         HCG URINE, QL. - POC          Collection Time: 05/14/20  1:48 PM         Result  Value  Ref Range            Pregnancy test,urine (POC)  Negative  NEG         COVID-19 WITH INFLUENZA A/B          Collection Time: 05/14/20  1:54 PM         Result  Value  Ref Range            SARS-CoV-2  Not detected          Influenza A by PCR  Not detected               Influenza B by PCR  Not detected              Radiologic Studies -      XR CHEST PA LAT       Final Result     Normal chest.                 CT Results   (Last 48 hours)          None                 CXR Results   (Last 48 hours)                                    05/14/20 1419    XR CHEST PA LAT  Final result            Impression:    Normal chest.                       Narrative:    EXAM: XR CHEST PA LAT             INDICATION: cough x 4 days             COMPARISON: None.             FINDINGS: PA and lateral radiographs of the chest demonstrate clear lungs. The      cardiac and mediastinal contours and pulmonary vascularity are normal. The bones      and soft tissues are within normal limits.  Medical Decision Making     I am  the first provider for this patient.      I reviewed the vital signs, available nursing notes, past medical history, past surgical history, family history and social history.      Vital Signs-Reviewed the patient's vital signs.      Records Reviewed: Nursing Notes and Old Medical Records      Provider Notes (Medical Decision Making):    Pt presents with abd pain, N/V/D, cough and CP x 4 days.  DDX: URI, gastroenteritis, bronchitis, PNA, viral illness, COVID-19, UTI, pregnancy.  Will get UA, COVID swab and CXR to evaluate.  Will also  give antiemetic now and do PO challenge.         Disposition:   Discharged      DISCHARGE NOTE:    2:52p        Care plan outlined and precautions discussed.  Patient has no new complaints, changes, or physical findings.  Results of imaging and labs were reviewed with the patient. All medications were reviewed with the patient; will d/c home. All of pt's questions  and concerns were addressed. Patient was instructed and agrees to follow up with PCP prn, as well as to return to the ED upon further deterioration. Patient is ready to go home.        Follow-up Information               Follow up With  Specialties  Details  Why  Contact Info              [facility]    In 1 week  As needed  [facility]   [address], [address]   [place] [id number]   [phone]              [facility]      As needed  [address]   [place] [id number]   [phone]              [facility]  Emergency Medicine    If symptoms worsen  [address]   [place] [id number]   [phone]                  Discharge Medication List as of 05/14/2020  2:52 PM              START taking these medications          Details        ondansetron (Zofran ODT) 4 mg disintegrating tablet  1 Tablet by SubLINGual route every eight (8) hours as needed for Nausea or Vomiting., Normal, Disp-10 Tablet, R-0                benzonatate (Tessalon Perles) 100 mg capsule  Take 1 Capsule by mouth three (3) times daily as needed for Cough for up to 5 days., Normal, Disp-15 Capsule, R-0               ibuprofen (MOTRIN) 600 mg tablet  Take 1 Tablet by mouth every six (6) hours as needed for Pain., Normal, Disp-20 Tablet, R-0                         Procedures:   Procedures      Please note that this dictation was completed with Dragon, Advertising account plannercomputer voice recognition software.  Quite often unanticipated grammatical, syntax, homophones, and other interpretive errors are inadvertently transcribed by the computer software.  Please disregard  these errors.  Additionally, please excuse any errors that have escaped final proofreading.        Diagnosis        Clinical Impression:       1.  Gastroenteritis         2.  Viral illness

## 2020-05-14 NOTE — ED Notes (Signed)
Pt given water and saltines for PO challenge. Pt given call bell with instructions to ring out to nurses station if she vomits. Pt verbalizes understanding.

## 2020-05-14 NOTE — ED Notes (Signed)
Pt declined being swabbed for COVID, provider aware.

## 2020-05-15 IMAGING — US US THYROID
2 series · 14 of 25 positions shown · non-contrast
Comparison: None available

CLINICAL DATA: Nodule

EXAM:
THYROID ULTRASOUND
TECHNIQUE: Ultrasound examination of the thyroid gland and adjacent soft
tissues was performed.

[Series 1: us thyroid · 11 of 99 slices shown (1 of 2)]
[im 1/99]
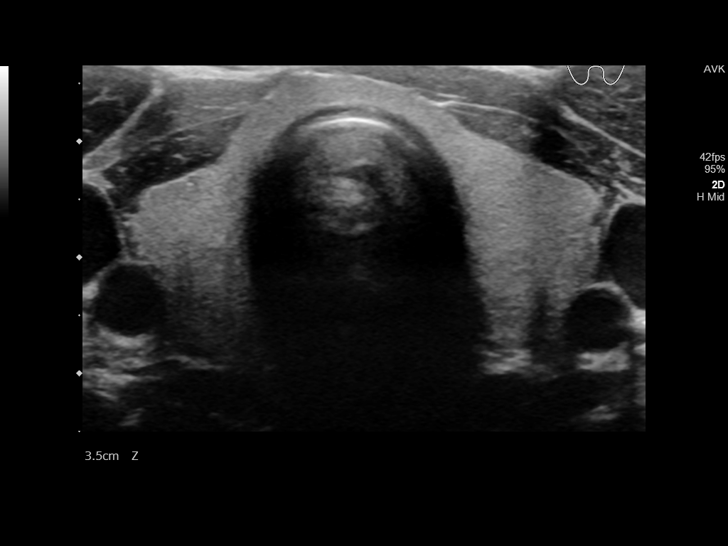
[im 11/99]
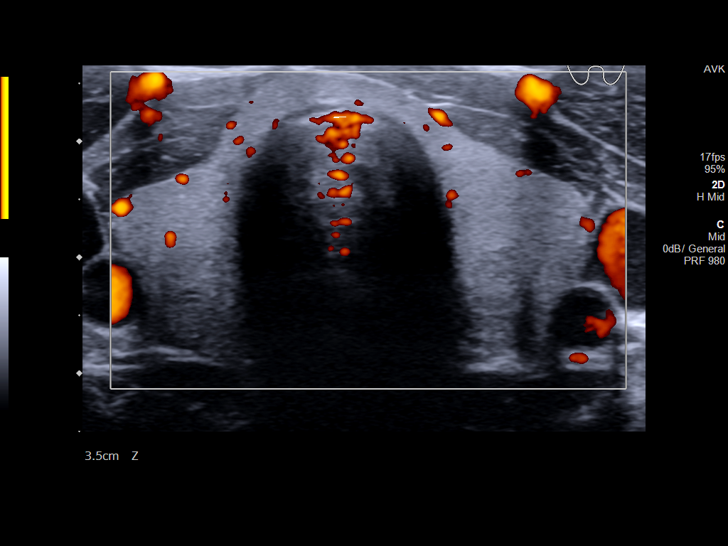
[im 21/99]
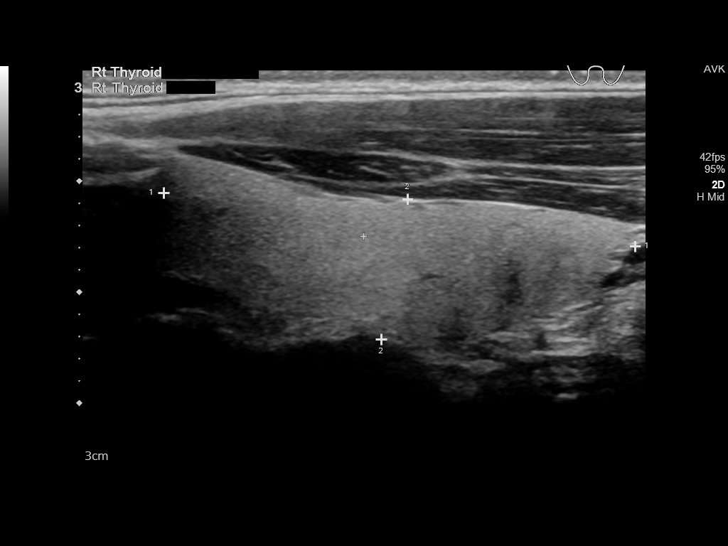
[im 31/99]
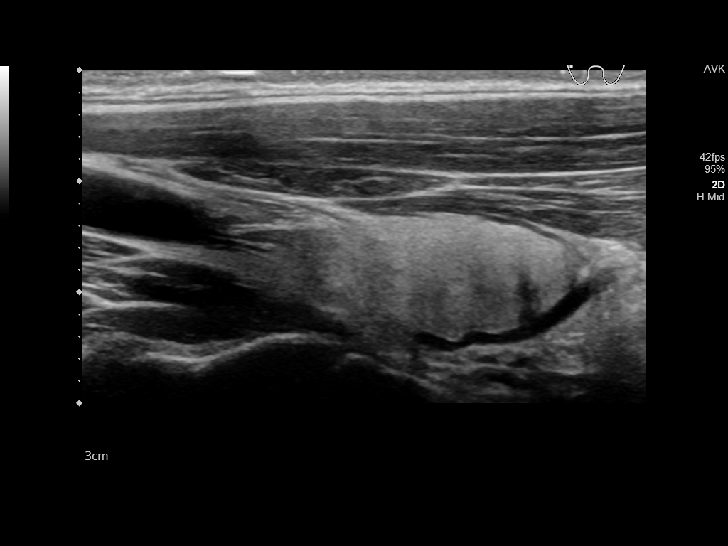
[im 42/99]
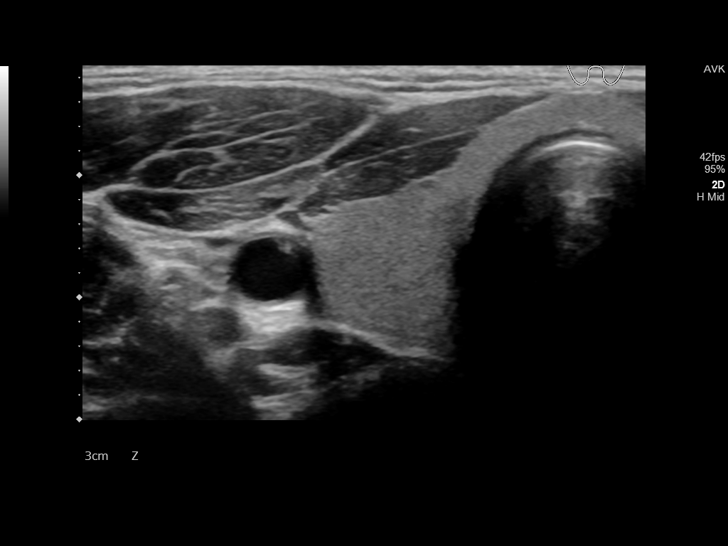
[im 47/99]
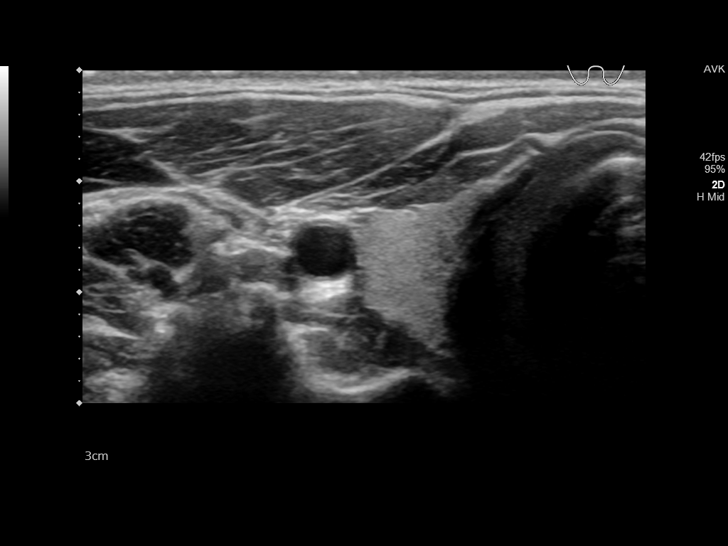
[im 57/99]
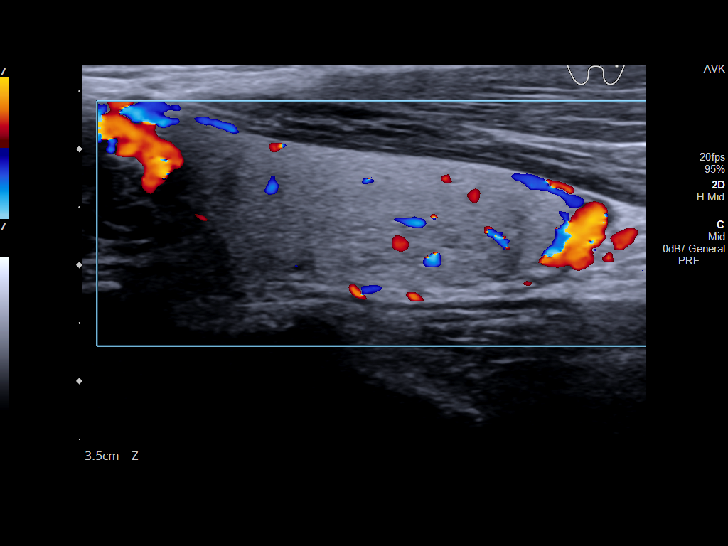
[im 68/99]
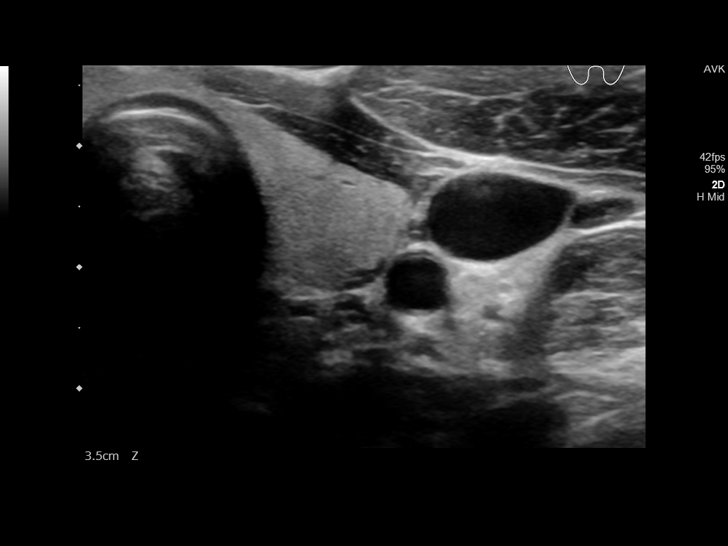
[im 78/99]
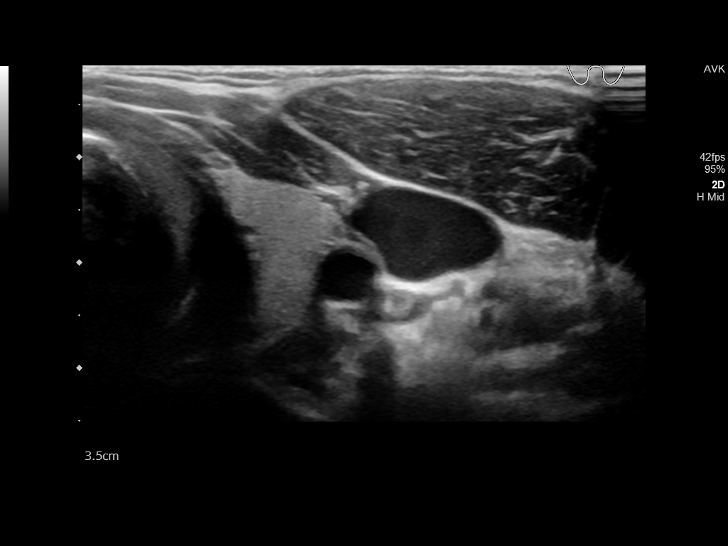
[im 83/99]
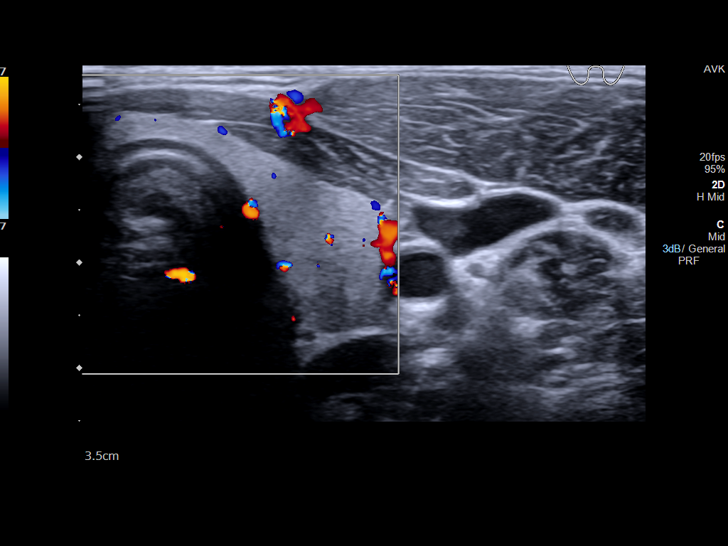
[im 93/99]
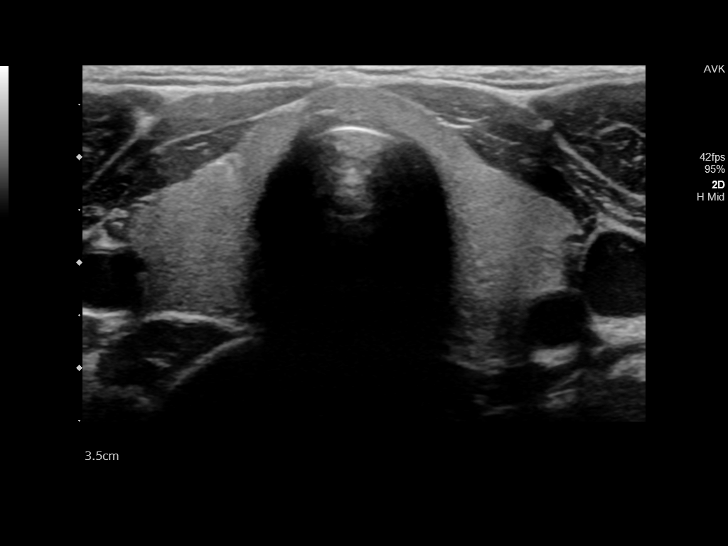

[Series 1001: us thyroid · 3 of 27 slices shown (2 of 2)]
[im 1/27]
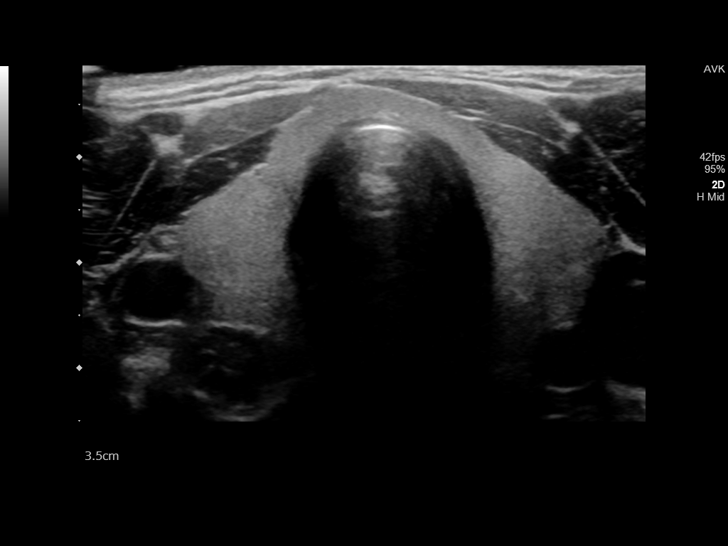
[im 14/27]
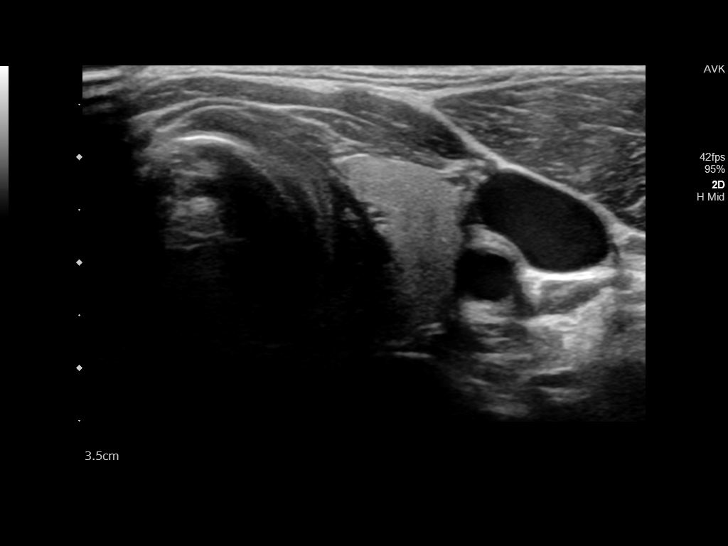
[im 27/27]
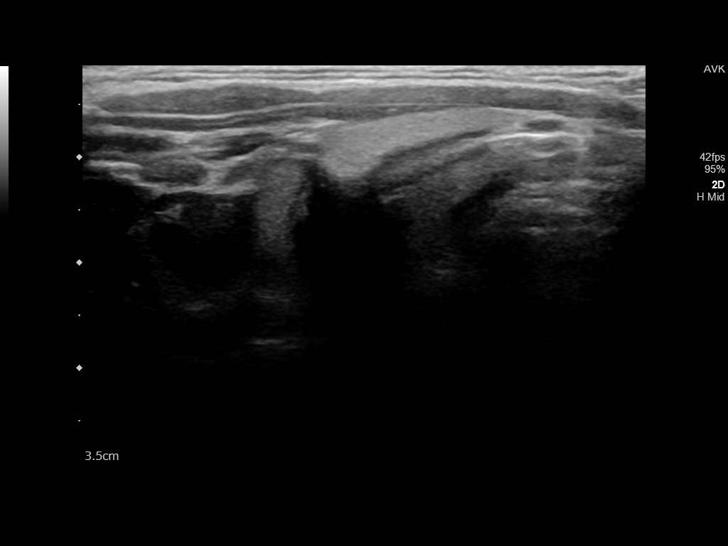

[14 of 25 positions shown; findings below may reference images not displayed]

FINDINGS: Parenchymal Echotexture: Mildly heterogenous

Isthmus: 0.3 cm thickness

Right lobe: 4.3 x 1.3 x 1.3 cm

Left lobe: 4.6 x 1.2 x 1.2 cm

_________________________________________________________

Estimated total number of nodules >/= 1 cm: 0

Number of spongiform nodules >/=  2 cm not described below (TR1): 0

Number of mixed cystic and solid nodules >/= 1.5 cm not described
below (TR2): 0

_________________________________________________________

No discrete nodules are seen within the thyroid gland.
IMPRESSION: 1. Normal-sized mildly heterogenous thyroid without discrete nodule.
No imaging indication for biopsy or follow-up.

The above is in keeping with the ACR TI-RADS recommendations - [HOSPITAL] 2571;[DATE].

## 2022-04-27 ENCOUNTER — Inpatient Hospital Stay
Admit: 2022-04-27 | Discharge: 2022-04-27 | Disposition: A | Discharge status: Home / Self Care | Payer: PRIVATE HEALTH INSURANCE | Attending: Student in an Organized Health Care Education/Training Program

## 2022-04-27 DIAGNOSIS — T25121A Burn of first degree of right foot, initial encounter: Secondary | ICD-10-CM

## 2022-04-27 MED ORDER — CEPHALEXIN 500 MG PO CAPS
500 MG | ORAL_CAPSULE | Freq: Four times a day (QID) | ORAL | 0 refills | Status: AC
Start: 2022-04-27 — End: 2022-05-04

## 2022-04-27 MED ORDER — BACITRACIN-POLYMYXIN B 500-10000 UNIT/GM EX OINT
500-10000 UNIT/GM | CUTANEOUS | 1 refills | Status: AC
Start: 2022-04-27 — End: 2022-05-04

## 2022-04-27 MED ORDER — BACITRACIN 500 UNIT/GM EX OINT
500 UNIT/GM | CUTANEOUS | Status: AC
Start: 2022-04-27 — End: 2022-04-27
  Administered 2022-04-27: 19:00:00 via TOPICAL

## 2022-04-27 MED FILL — BACITRACIN 500 UNIT/GM EX OINT: 500 UNIT/GM | CUTANEOUS | Qty: 1

## 2022-04-27 NOTE — ED Provider Notes (Signed)
[facility] EMERGENCY DEPT  EMERGENCY DEPARTMENT ENCOUNTER      Pt Name: Ariel Jacobs  MRN: [id number]  Birthdate 10/21/1986  Date of evaluation: 04/27/2022  Provider: Nelwyn Salisbury, APRN - NP      HISTORY OF PRESENT ILLNESS      This is a 36 year old female who presents to the emergency room with complaints of pain on the superior portion of the right foot.     The history is provided by the patient.         Nursing Notes were reviewed.    REVIEW OF SYSTEMS         Review of Systems   Constitutional:  Negative for appetite change, chills, diaphoresis and fatigue.   HENT:  Negative for congestion, sinus pressure and sinus pain.    Eyes:  Negative for pain and redness.   Respiratory:  Negative for cough and shortness of breath.    Cardiovascular:  Negative for chest pain and palpitations.   Gastrointestinal:  Negative for abdominal distention, abdominal pain, nausea and vomiting.   Genitourinary:  Negative for difficulty urinating, frequency and urgency.   Musculoskeletal:  Negative for arthralgias, neck pain and neck stiffness.   Skin:  Positive for color change and wound (right foot greater than left foot, chemical burn). Negative for pallor and rash.   Neurological:  Negative for speech difficulty and headaches.   Hematological:  Does not bruise/bleed easily.   Psychiatric/Behavioral:  The patient is not nervous/anxious.          PAST MEDICAL HISTORY     Past Medical History:   Diagnosis Date    Asthma          SURGICAL HISTORY     History reviewed. No pertinent surgical history.      CURRENT MEDICATIONS       Previous Medications    IBUPROFEN (ADVIL;MOTRIN) 600 MG TABLET    Take 1 tablet by mouth every 6 hours as needed    ONDANSETRON (ZOFRAN-ODT) 4 MG DISINTEGRATING TABLET    Place 1 tablet under the tongue every 8 hours as needed       ALLERGIES     Patient has no known allergies.    FAMILY HISTORY     History reviewed. No pertinent family history.       SOCIAL HISTORY       Social History     Socioeconomic  History    Marital status: Married     Spouse name: None    Number of children: None    Years of education: None    Highest education level: None   Tobacco Use    Smoking status: Never    Smokeless tobacco: Never   Substance and Sexual Activity    Alcohol use: Never    Drug use: Never         PHYSICAL EXAM       ED Triage Vitals [04/27/22 1420]   BP Temp Temp Source Pulse Respirations SpO2 Height Weight   122/88 98.2 F (36.8 C) Oral 85 16 100 % -- --       There is no height or weight on file to calculate BMI.    Physical Exam  Constitutional:       General: She is not in acute distress.     Appearance: She is not ill-appearing.      Comments: Well-appearing, no distress   HENT:      Head: Normocephalic.  Right Ear: External ear normal.      Left Ear: External ear normal.      Nose: No congestion.      Mouth/Throat:      Mouth: Mucous membranes are moist.   Eyes:      General: No scleral icterus.     Conjunctiva/sclera: Conjunctivae normal.   Cardiovascular:      Rate and Rhythm: Normal rate.   Pulmonary:      Effort: Pulmonary effort is normal. No respiratory distress.   Abdominal:      General: There is no distension.      Tenderness: There is no guarding.   Genitourinary:     Comments: Deferred    Musculoskeletal:         General: No tenderness. Normal range of motion.      Cervical back: Normal range of motion and neck supple.   Skin:     General: Skin is warm and dry.      Findings: Erythema (right foot) present.   Neurological:      General: No focal deficit present.      Mental Status: She is alert and oriented to person, place, and time. Mental status is at baseline.   Psychiatric:         Mood and Affect: Mood normal.         Behavior: Behavior normal.         Thought Content: Thought content normal.         Judgment: Judgment normal.           EMERGENCY DEPARTMENT COURSE and DIFFERENTIAL DIAGNOSIS/MDM:   Vitals:    Vitals:    04/27/22 1420   BP: 122/88   Pulse: 85   Resp: 16   Temp: 98.2 F (36.8 C)    TempSrc: Oral   SpO2: 100%         Medical Decision Making  Differential diagnosis includes chemical burn, cellulitis, non healing wound and others.     This is a 36 year old female who presents to the emergency room with complaints of pain on the superior portion of the right foot. Patient states she had a chemical burn to the top of her foot while at work about three days ago. Patient states she was using the product ERASE at work when she spilled it on her foot. Patient states she had the liquid on bilateral feet but the majority was on the right. Patient states it burned "so much I could not even rinse the foot." Comes today with continued pain and increased cellulitis, right foot greater than left. Denies any chest pain, shortness of breath, dizziness, nausea or vomiting, fever or chills. Can weight bear without difficulty. Has not taken any medication prior to arrival for her symptoms. No further complaints at this time.     Physical assessment complete. No indication for imaging or lab data at this time. Discussed with Dr. Jens Som and he is in agreement with bacitracin, non adherent dressing and cover. Po antibiotics for increased erythema that I can not rule out as cellulitis. Discharge to home and follow up with PCP. Follow up with wound care clinic. Patient in agreement with plan of care.     Any available vitals, labs, images, nursing notes, medications, allergies, PMH, PSH and/or previous records in the chart were reviewed. All of these were considered in the medical decision making process. I individually reviewed any labs and any images obtained. This was discussed with my attending  and he/she is in agreement with plan of care.         Problems Addressed:  Superficial burn of right foot, initial encounter: acute illness or injury    Risk  OTC drugs.  Prescription drug management.      2:43 PM  Pt has been reexamined.  Pt has no new complaints, changes or physical findings. Care plan outlined and  precautions discussed. All available results were reviewed with pt. All medications were reviewed with pt. All of pt's questions and concerns were addressed. Pt agrees to F/U as instructed and agrees to return to ED upon further deterioration. Pt is ready to go home.  Nelwyn Salisbury, APRN - NP          (Please note that portions of this note were completed with a voice recognition program.  Efforts were made to edit the dictations but occasionally words are mis-transcribed.)    Nelwyn Salisbury, APRN - NP (electronically signed)  Emergency Attending Physician              Juanda Chance, APRN - NP  04/27/22 [phone]

## 2022-04-27 NOTE — ED Triage Notes (Signed)
Pt comes in with complaints of a chemical spill on her right foot at work three days ago. Pt reports having rinsed it with water after work. Pt's foot has some visibly scabbing and some open skin with a dry red/pink wound bed.

## 2022-04-27 NOTE — ED Notes (Signed)
Patient does not appear to be in any acute distress/shows no evidence of clinical instability at this time.     Reviewed discharge instructions, prescriptions, education and follow up information with patient.     Pt verbalizing understanding. Pt at baseline cardiac, respiratory, and neuro function. Pain controlled.     Patient left ER under baseline transfer modality.       Maxwell Caul, RN  04/27/22 [phone]

## 2022-09-04 ENCOUNTER — Encounter (INDEPENDENT_AMBULATORY_CARE_PROVIDER_SITE_OTHER): Payer: Self-pay

## 2022-12-12 ENCOUNTER — Other Ambulatory Visit: Payer: Medicaid Other

## 2022-12-19 ENCOUNTER — Ambulatory Visit: Payer: Medicaid Other | Admitting: Family Medicine

## 2023-03-09 ENCOUNTER — Ambulatory Visit
Admission: EM | Admit: 2023-03-09 | Discharge: 2023-03-09 | Disposition: A | Discharge status: Home / Self Care | Payer: Medicaid Other | Attending: Nurse Practitioner | Admitting: Nurse Practitioner

## 2023-03-09 DIAGNOSIS — H6123 Impacted cerumen, bilateral: Secondary | ICD-10-CM

## 2023-03-09 DIAGNOSIS — H60501 Unspecified acute noninfective otitis externa, right ear: Secondary | ICD-10-CM

## 2023-03-09 DIAGNOSIS — H9203 Otalgia, bilateral: Secondary | ICD-10-CM | POA: Diagnosis not present

## 2023-03-09 MED ORDER — NEOMYCIN-POLYMYXIN-HC 3.5-10000-1 OT SUSP: 4.0000 [drp] | Freq: Three times a day (TID) | OTIC | 0 refills | Status: AC

## 2023-03-09 NOTE — ED Provider Notes (Signed)
Renaldo Fiddler    CSN: [id number] Arrival date & time: 03/09/23  1414      History   Chief Complaint Chief Complaint  Patient presents with   Otalgia    HPI Makayla Freeman is a 37 y.o. female presents for evaluation of ear pain.  Patient reports 3 days of bilateral ear pain greater than left.  Denies any cough or congestion.  No fevers.  No drainage from the ears.  She does use Q-tips.  She is also been using Tylenol, Aleve, OTC eardrops without improvement. No other concerns at this time.    Otalgia   Past Medical History:  Diagnosis Date   Asthma    per pt   Positive QuantiFERON-TB Gold test     There are no problems to display for this patient.   History reviewed. No pertinent surgical history.  OB History   No obstetric history on file.      Home Medications    Prior to Admission medications   Medication Sig Start Date End Date Taking? Authorizing Provider  neomycin-polymyxin-hydrocortisone (CORTISPORIN) 3.5-10000-1 OTIC suspension Place 4 drops into the right ear 3 (three) times daily for 7 days. 03/09/23 03/16/23 Yes Radford Pax, NP  albuterol (ACCUNEB) 0.63 MG/3ML nebulizer solution Take 1 ampule by nebulization every 6 (six) hours as needed for wheezing. As needed - dx of asthma per pt    [provider]  Cholecalciferol (VITAMIN D HIGH POTENCY) 25 MCG (1000 UT) capsule Take 1,000 Units by mouth daily.    [provider]    Family History History reviewed. No pertinent family history.  Social History Social History   Tobacco Use   Smoking status: Never   Smokeless tobacco: Never     Allergies   Patient has no known allergies.   Review of Systems Review of Systems  HENT:  Positive for ear pain.      Physical Exam Triage Vital Signs ED Triage Vitals  Enc Vitals Group     BP 03/09/23 1453 107/72     Pulse Rate 03/09/23 1453 77     Resp 03/09/23 1453 18     Temp 03/09/23 1453 97.7 F (36.5 C)     Temp src --       SpO2 03/09/23 1453 99 %     Weight --      Height --      Head Circumference --      Peak Flow --      Pain Score 03/09/23 1459 10     Pain Loc --      Pain Edu? --      Excl. in GC? --    No data found.  Updated Vital Signs BP 107/72   Pulse 77   Temp 97.7 F (36.5 C)   Resp 18   LMP 02/23/2023   SpO2 99%   Visual Acuity Right Eye Distance:   Left Eye Distance:   Bilateral Distance:    Right Eye Near:   Left Eye Near:    Bilateral Near:     Physical Exam Vitals and nursing note reviewed.  Constitutional:      General: She is not in acute distress.    Appearance: She is well-developed. She is not ill-appearing.  HENT:     Head: Normocephalic and atraumatic.     Right Ear: Tympanic membrane and ear canal normal. There is impacted cerumen.     Left Ear: Tympanic membrane and ear canal normal. There  is impacted cerumen.     Ears:     Comments: After irrigation right canal with slight swelling and erythema. TM WNL. Left canal and TM WNL    Nose: No congestion.     Mouth/Throat:     Mouth: Mucous membranes are moist.     Pharynx: Oropharynx is clear. Uvula midline. No posterior oropharyngeal erythema.     Tonsils: No tonsillar exudate or tonsillar abscesses.  Eyes:     Conjunctiva/sclera: Conjunctivae normal.     Pupils: Pupils are equal, round, and reactive to light.  Cardiovascular:     Rate and Rhythm: Normal rate and regular rhythm.     Heart sounds: Normal heart sounds.  Pulmonary:     Effort: Pulmonary effort is normal.     Breath sounds: Normal breath sounds.  Musculoskeletal:     Cervical back: Normal range of motion and neck supple.  Lymphadenopathy:     Cervical: No cervical adenopathy.  Skin:    General: Skin is warm and dry.  Neurological:     General: No focal deficit present.     Mental Status: She is alert and oriented to person, place, and time.  Psychiatric:        Mood and Affect: Mood normal.        Behavior: Behavior normal.       UC Treatments / Results  Labs (all labs ordered are listed, but only abnormal results are displayed) Labs Reviewed - No data to display  EKG   Radiology No results found.  Procedures Procedures (including critical care time)  Medications Ordered in UC Medications - No data to display  Initial Impression / Assessment and Plan / UC Course  I have reviewed the triage vital signs and the nursing notes.  Pertinent labs & imaging results that were available during my care of the patient were reviewed by me and considered in my medical decision making (see chart for details).     Bilateral ear irrigation by nursing staff.  Patient tolerated well Start Cortisporin to the right ear.  Patient instructed to stop using Q-tips and to keep water out of the ear until treatment is complete Follow-up with PCP if symptoms do not improve ER precautions reviewed and patient verbalized understanding Final Clinical Impressions(s) / UC Diagnoses   Final diagnoses:  Bilateral impacted cerumen  Otalgia of both ears  Acute otitis externa of right ear, unspecified type     Discharge Instructions      Start antibiotic drops to the right ear as prescribed Keep water out of the ear until treatment is complete.  Do not use Q-tips or peroxide.  Follow-up with your PCP if your symptoms or not improving Please go to the ER if you have any worsening symptoms     ED Prescriptions     Medication Sig Dispense Auth. Provider   neomycin-polymyxin-hydrocortisone (CORTISPORIN) 3.5-10000-1 OTIC suspension Place 4 drops into the right ear 3 (three) times daily for 7 days. 10 mL Radford Pax, NP      PDMP not reviewed this encounter.   Radford Pax, NP 03/09/23 1535

## 2023-03-09 NOTE — Discharge Instructions (Signed)
Start antibiotic drops to the right ear as prescribed Keep water out of the ear until treatment is complete.  Do not use Q-tips or peroxide.  Follow-up with your PCP if your symptoms or not improving Please go to the ER if you have any worsening symptoms

## 2023-03-09 NOTE — ED Triage Notes (Signed)
Patient to Urgent Care with complaints of bilateral ear pain that started on Tuesday. Denies any fevers.  Has been taking tylenol/ aleve/ using otc ear drops.

## 2023-11-13 ENCOUNTER — Other Ambulatory Visit: Payer: Self-pay | Admitting: Physician Assistant

## 2023-11-13 DIAGNOSIS — Z1231 Encounter for screening mammogram for malignant neoplasm of breast: Secondary | ICD-10-CM

## 2023-11-21 ENCOUNTER — Other Ambulatory Visit: Payer: Self-pay | Admitting: Physician Assistant

## 2023-11-21 DIAGNOSIS — N6459 Other signs and symptoms in breast: Secondary | ICD-10-CM

## 2023-11-21 DIAGNOSIS — N644 Mastodynia: Secondary | ICD-10-CM

## 2023-11-21 DIAGNOSIS — N6452 Nipple discharge: Secondary | ICD-10-CM

## 2023-12-04 ENCOUNTER — Other Ambulatory Visit: Payer: Medicaid Other

## 2023-12-14 ENCOUNTER — Other Ambulatory Visit: Payer: Medicaid Other

## 2024-01-18 ENCOUNTER — Ambulatory Visit
Admission: RE | Admit: 2024-01-18 | Discharge: 2024-01-18 | Disposition: A | Discharge status: Home / Self Care | Payer: Medicaid Other | Source: Ambulatory Visit | Attending: Physician Assistant | Admitting: Physician Assistant

## 2024-01-18 DIAGNOSIS — N6459 Other signs and symptoms in breast: Secondary | ICD-10-CM

## 2024-01-18 DIAGNOSIS — N644 Mastodynia: Secondary | ICD-10-CM

## 2024-01-18 DIAGNOSIS — N6452 Nipple discharge: Secondary | ICD-10-CM | POA: Insufficient documentation

## 2024-05-12 ENCOUNTER — Other Ambulatory Visit: Payer: Self-pay

## 2024-05-13 ENCOUNTER — Other Ambulatory Visit: Payer: Self-pay

## 2024-05-15 ENCOUNTER — Other Ambulatory Visit: Payer: Self-pay

## 2024-05-26 ENCOUNTER — Other Ambulatory Visit: Payer: Self-pay

## 2024-05-28 ENCOUNTER — Other Ambulatory Visit: Payer: Self-pay

## 2024-10-07 ENCOUNTER — Ambulatory Visit (LOCAL_COMMUNITY_HEALTH_CENTER): Payer: Self-pay

## 2024-10-07 DIAGNOSIS — Z111 Encounter for screening for respiratory tuberculosis: Secondary | ICD-10-CM

## 2024-10-09 ENCOUNTER — Ambulatory Visit: Payer: Self-pay

## 2024-10-09 DIAGNOSIS — Z111 Encounter for screening for respiratory tuberculosis: Secondary | ICD-10-CM

## 2024-10-09 LAB — TB SKIN TEST
Induration: 0 mm
TB Skin Test: NEGATIVE
